# Patient Record
Sex: Male | Born: 1995 | Race: White | Hispanic: No | Marital: Single | Smoking: Never smoker
Health system: Southern US, Community
[De-identification: ages and names within clinical notes are randomized; demographics above are authoritative.]

## PROBLEM LIST (undated history)

## (undated) DIAGNOSIS — F419 Anxiety disorder, unspecified: Secondary | ICD-10-CM

---
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
## 2017-09-04 ENCOUNTER — Encounter (HOSPITAL_COMMUNITY): Payer: Self-pay | Admitting: Emergency Medicine

## 2017-09-04 ENCOUNTER — Ambulatory Visit (HOSPITAL_COMMUNITY)
Admission: EM | Admit: 2017-09-04 | Discharge: 2017-09-04 | Disposition: A | Discharge status: Home / Self Care | Payer: Self-pay | Attending: Family Medicine | Admitting: Family Medicine

## 2017-09-04 DIAGNOSIS — H66002 Acute suppurative otitis media without spontaneous rupture of ear drum, left ear: Secondary | ICD-10-CM

## 2017-09-04 MED ORDER — IBUPROFEN 800 MG PO TABS: 800.0000 mg | ORAL_TABLET | Freq: Three times a day (TID) | ORAL | 0 refills | Status: DC

## 2017-09-04 MED ORDER — CEFDINIR 300 MG PO CAPS: 300.0000 mg | ORAL_CAPSULE | Freq: Two times a day (BID) | ORAL | 0 refills | Status: AC

## 2017-09-04 NOTE — ED Triage Notes (Signed)
Pt c/o L ear pain todday

## 2017-09-04 NOTE — Discharge Instructions (Signed)
Take omnicef twice daily for 10 days  Use anti-inflammatories for pain/swelling. You may take up to 800 mg Ibuprofen every 8 hours with food. You may supplement Ibuprofen with Tylenol [phone] mg every 8 hours.   Please return if symptoms not improving

## 2017-09-04 NOTE — ED Provider Notes (Signed)
[facility]    CSN: [id number] Arrival date & time: 09/04/17  1921     History   Chief Complaint Chief Complaint  Patient presents with  . Otalgia    HPI Ryan Mckenzie is a 22 y.o. male no significant past medical history presenting today for evaluation of left ear pain.  He states that his pain began earlier today.  He woke up with a sore throat and then turned into throbbing ear pain.  He feels like he has an ear infection.  Also noting some rhinorrhea.  Is not taking anything for the congestion.  Did take an Aleve approximately 1 hour ago.  Minimal cough.  Subjective fevers.  Denies nausea or vomiting.  HPI  History reviewed. No pertinent past medical history.  There are no active problems to display for this patient.   History reviewed. No pertinent surgical history.     Home Medications    Prior to Admission medications   Medication Sig Start Date End Date Taking? Authorizing Provider  cefdinir (OMNICEF) 300 MG capsule Take 1 capsule (300 mg total) by mouth 2 (two) times daily for 10 days. 09/04/17 09/14/17  Nevaeha Finerty C, PA-C  ibuprofen (ADVIL,MOTRIN) 800 MG tablet Take 1 tablet (800 mg total) by mouth 3 (three) times daily. 09/04/17   Avriel Kandel, Junius Creamer, PA-C    Family History No family history on file.  Social History Social History   Tobacco Use  . Smoking status: Passive Smoke Exposure - Never Smoker  Substance Use Topics  . Alcohol use: No  . Drug use: No     Allergies   Patient has no known allergies.   Review of Systems Review of Systems  Constitutional: Positive for fever. Negative for activity change, appetite change and fatigue.  HENT: Positive for congestion, ear pain and rhinorrhea. Negative for postnasal drip, sinus pressure and sore throat.   Eyes: Negative for pain and itching.  Respiratory: Negative for cough and shortness of breath.   Cardiovascular: Negative for chest pain.  Gastrointestinal: Negative for abdominal  pain, diarrhea, nausea and vomiting.  Musculoskeletal: Negative for myalgias.  Skin: Negative for rash.  Neurological: Negative for dizziness, light-headedness and headaches.     Physical Exam Triage Vital Signs ED Triage Vitals [09/04/17 1938]  Enc Vitals Group     BP 136/79     Pulse Rate 68     Resp 16     Temp 98.1 F (36.7 C)     Temp src      SpO2 100 %     Weight      Height      Head Circumference      Peak Flow      Pain Score      Pain Loc      Pain Edu?      Excl. in [place]?    No data found.  Updated Vital Signs BP 136/79   Pulse 68   Temp 98.1 F (36.7 C)   Resp 16   SpO2 100%   Visual Acuity Right Eye Distance:   Left Eye Distance:   Bilateral Distance:    Right Eye Near:   Left Eye Near:    Bilateral Near:     Physical Exam  Constitutional: He appears well-developed and well-nourished.  HENT:  Head: Normocephalic and atraumatic.  Mouth/Throat: Oropharynx is clear and moist.  Left TM significantly erythematous and dull, bulging  Right TM nonerythematous, good bony landmarks and cone of light.  Eyes: Conjunctivae are normal.  Neck: Neck supple.  Cardiovascular: Normal rate and regular rhythm.  No murmur heard. Pulmonary/Chest: Effort normal and breath sounds normal. No respiratory distress.  Abdominal: Soft. There is no tenderness.  Musculoskeletal: He exhibits no edema.  Neurological: He is alert.  Skin: Skin is warm and dry.  Psychiatric: He has a normal mood and affect.  Nursing note and vitals reviewed.    UC Treatments / Results  Labs (all labs ordered are listed, but only abnormal results are displayed) Labs Reviewed - No data to display  EKG None  Radiology No results found.  Procedures Procedures (including critical care time)  Medications Ordered in UC Medications - No data to display  Initial Impression / Assessment and Plan / UC Course  I have reviewed the triage vital signs and the nursing notes.  Pertinent  labs & imaging results that were available during my care of the patient were reviewed by me and considered in my medical decision making (see chart for details).     Patient with left otitis media, will treat with Omnicef twice daily for 10 days.  Tylenol and ibuprofen for pain.  Recommended to start daily allergy pill to help with congestion and postnasal drainage related/causing sore throat.Discussed strict return precautions. Patient verbalized understanding and is agreeable with plan.  Final Clinical Impressions(s) / UC Diagnoses   Final diagnoses:  Non-recurrent acute suppurative otitis media of left ear without spontaneous rupture of tympanic membrane     Discharge Instructions     Take omnicef twice daily for 10 days  Use anti-inflammatories for pain/swelling. You may take up to 800 mg Ibuprofen every 8 hours with food. You may supplement Ibuprofen with Tylenol [phone] every 8 hours.   Please return if symptoms not improving    ED Prescriptions    Medication Sig Dispense Auth. Provider   cefdinir (OMNICEF) 300 MG capsule Take 1 capsule (300 mg total) by mouth 2 (two) times daily for 10 days. 20 capsule Elyan Vanwieren C, PA-C   ibuprofen (ADVIL,MOTRIN) 800 MG tablet Take 1 tablet (800 mg total) by mouth 3 (three) times daily. 21 tablet Floree Zuniga, [place] C, PA-C     Controlled Substance Prescriptions [place] Controlled Substance Registry consulted? Not Applicable   Lew DawesWieters, Kadisha Goodine C, [place]PA-C 09/04/17 2013

## 2018-11-23 ENCOUNTER — Other Ambulatory Visit: Payer: Self-pay

## 2018-11-23 DIAGNOSIS — Z20822 Contact with and (suspected) exposure to covid-19: Secondary | ICD-10-CM

## 2018-11-24 LAB — NOVEL CORONAVIRUS, NAA: SARS-CoV-2, NAA: NOT DETECTED

## 2019-02-24 ENCOUNTER — Other Ambulatory Visit: Payer: Self-pay

## 2019-02-24 DIAGNOSIS — Z20822 Contact with and (suspected) exposure to covid-19: Secondary | ICD-10-CM

## 2019-02-27 LAB — NOVEL CORONAVIRUS, NAA: SARS-CoV-2, NAA: NOT DETECTED

## 2019-05-02 ENCOUNTER — Other Ambulatory Visit: Payer: Self-pay

## 2019-05-02 ENCOUNTER — Ambulatory Visit (HOSPITAL_COMMUNITY)
Admission: EM | Admit: 2019-05-02 | Discharge: 2019-05-02 | Disposition: A | Discharge status: Home / Self Care | Payer: Self-pay | Attending: Internal Medicine | Admitting: Internal Medicine

## 2019-05-02 ENCOUNTER — Encounter (HOSPITAL_COMMUNITY): Payer: Self-pay

## 2019-05-02 DIAGNOSIS — N23 Unspecified renal colic: Secondary | ICD-10-CM

## 2019-05-02 LAB — POCT URINALYSIS DIP (DEVICE)
Bilirubin Urine: NEGATIVE
Glucose, UA: NEGATIVE mg/dL
Ketones, ur: NEGATIVE mg/dL
Leukocytes,Ua: NEGATIVE
Nitrite: NEGATIVE
Protein, ur: NEGATIVE mg/dL
Specific Gravity, Urine: <=1.005 (ref 1.005–1.030)
Urobilinogen, UA: 0.2 mg/dL (ref 0.0–1.0)
pH: 6.5 (ref 5.0–8.0)

## 2019-05-02 MED ORDER — IBUPROFEN 600 MG PO TABS: 600.0000 mg | ORAL_TABLET | Freq: Four times a day (QID) | ORAL | 0 refills | Status: DC | PRN

## 2019-05-02 MED ORDER — TAMSULOSIN HCL 0.4 MG PO CAPS: 0.4000 mg | ORAL_CAPSULE | Freq: Every day | ORAL | 0 refills | Status: AC

## 2019-05-02 NOTE — ED Triage Notes (Signed)
Pt presents to UC with right lower quadrant pain and urinary frequency since last night.

## 2019-05-02 NOTE — ED Provider Notes (Signed)
[facility]    CSN: [id number] Arrival date & time: 05/02/19  0831      History   Chief Complaint Chief Complaint  Patient presents with  . Abdominal Pain  . Urinary Frequency    HPI Ryan Mckenzie is a 24 y.o. male with no past medical history comes to urgent care with complaint of right flank/abdominal pain of 1 day duration.  Onset of pain was sudden and is currently 6 out of 10.  Pain is sharp and shooting at times and at other times is dull.  No known aggravating or relieving factors.  The pain comes in waves and radiates to the right groin region.  No nausea or vomiting.  Patient admits to having some dysuria but no frequency, urgency or hematuria.  Patient denies a history of kidney stones.  HPI  History reviewed. No pertinent past medical history.  There are no problems to display for this patient.   History reviewed. No pertinent surgical history.     Home Medications    Prior to Admission medications   Medication Sig Start Date End Date Taking? Authorizing Provider  ibuprofen (ADVIL) 600 MG tablet Take 1 tablet (600 mg total) by mouth every 6 (six) hours as needed. 05/02/19   Shemicka Cohrs, Myrene Galas, MD  tamsulosin (FLOMAX) 0.4 MG CAPS capsule Take 1 capsule (0.4 mg total) by mouth daily for 7 days. 05/02/19 05/09/19  Chase Picket, MD    Family History History reviewed. No pertinent family history.  Social History Social History   Tobacco Use  . Smoking status: Passive Smoke Exposure - Never Smoker  . Smokeless tobacco: Never Used  Substance Use Topics  . Alcohol use: No  . Drug use: No     Allergies   Patient has no known allergies.   Review of Systems Review of Systems  Constitutional: Negative for chills, fatigue and fever.  Respiratory: Negative for shortness of breath.   Gastrointestinal: Negative for abdominal pain, nausea and vomiting.  Genitourinary: Positive for dysuria and flank pain. Negative for discharge, enuresis, frequency,  genital sores, hematuria, penile pain, scrotal swelling, testicular pain and urgency.  Skin: Negative for rash and wound.  Neurological: Negative for dizziness and numbness.  Psychiatric/Behavioral: Negative for confusion and decreased concentration.     Physical Exam Triage Vital Signs ED Triage Vitals  Enc Vitals Group     BP 05/02/19 0927 135/72     Pulse Rate 05/02/19 0927 79     Resp --      Temp 05/02/19 0927 98.7 F (37.1 C)     Temp Source 05/02/19 0927 Oral     SpO2 05/02/19 0927 98 %     Weight --      Height --      Head Circumference --      Peak Flow --      Pain Score 05/02/19 0926 6     Pain Loc --      Pain Edu? --      Excl. in [place]? --    No data found.  Updated Vital Signs BP 135/72 (BP Location: Left Arm)   Pulse 79   Temp 98.7 F (37.1 C) (Oral)   SpO2 98%   Visual Acuity Right Eye Distance:   Left Eye Distance:   Bilateral Distance:    Right Eye Near:   Left Eye Near:    Bilateral Near:     Physical Exam Vitals and nursing note reviewed.  Constitutional:  General: He is not in acute distress.    Appearance: He is well-developed. He is not ill-appearing.  Cardiovascular:     Rate and Rhythm: Normal rate and regular rhythm.  Pulmonary:     Effort: Pulmonary effort is normal.     Breath sounds: Normal breath sounds. No wheezing or rhonchi.  Abdominal:     Palpations: Abdomen is rigid. There is no shifting dullness, hepatomegaly or splenomegaly.     Tenderness: There is no abdominal tenderness. There is no right CVA tenderness, left CVA tenderness or guarding.     Hernia: No hernia is present.  Skin:    Capillary Refill: Capillary refill takes less than 2 seconds.  Neurological:     General: No focal deficit present.     Mental Status: He is alert.      UC Treatments / Results  Labs (all labs ordered are listed, but only abnormal results are displayed) Labs Reviewed  POCT URINALYSIS DIP (DEVICE) - Abnormal; Notable for the  following components:      Result Value   Hgb urine dipstick TRACE (*)    All other components within normal limits  URINE CULTURE    EKG   Radiology No results found.  Procedures Procedures (including critical care time)  Medications Ordered in UC Medications - No data to display  Initial Impression / Assessment and Plan / UC Course  I have reviewed the triage vital signs and the nursing notes.  Pertinent labs & imaging results that were available during my care of the patient were reviewed by me and considered in my medical decision making (see chart for details).    1.  Nephrolithiasis with right renal colic: Urinalysis is positive for blood Patient most likely has kidney stone which may have passed. Urine culture sent Tamsulosin 0.4 mg orally daily for 7 days Ibuprofen 600 mg every 6 hours as needed for pain If patient's pain worsens or if he develops fever, chills, worsening flank pain, nausea vomiting-he is advised to return to urgent care to be reevaluated.  If the urine culture grows any bacteria patient's treatment regimen will be updated to reflect the urine cultures. Final Clinical Impressions(s) / UC Diagnoses   Final diagnoses:  Renal colic on right side   Discharge Instructions   None    ED Prescriptions    Medication Sig Dispense Auth. Provider   ibuprofen (ADVIL) 600 MG tablet Take 1 tablet (600 mg total) by mouth every 6 (six) hours as needed. 30 tablet Jacquese Cassarino, Britta Mccreedy, MD   tamsulosin (FLOMAX) 0.4 MG CAPS capsule Take 1 capsule (0.4 mg total) by mouth daily for 7 days. 7 capsule Candiss Galeana, Britta Mccreedy, MD     PDMP not reviewed this encounter.   Merrilee Jansky, MD 05/03/19 [phone]

## 2019-05-03 LAB — URINE CULTURE: Culture: NO GROWTH

## 2019-07-14 ENCOUNTER — Other Ambulatory Visit: Payer: Self-pay

## 2019-07-14 ENCOUNTER — Ambulatory Visit (HOSPITAL_COMMUNITY)
Admission: EM | Admit: 2019-07-14 | Discharge: 2019-07-14 | Disposition: A | Discharge status: Home / Self Care | Payer: 59 | Attending: Family Medicine | Admitting: Family Medicine

## 2019-07-14 ENCOUNTER — Encounter (HOSPITAL_COMMUNITY): Payer: Self-pay

## 2019-07-14 DIAGNOSIS — Z20822 Contact with and (suspected) exposure to covid-19: Secondary | ICD-10-CM | POA: Diagnosis present

## 2019-07-14 DIAGNOSIS — Z9109 Other allergy status, other than to drugs and biological substances: Secondary | ICD-10-CM | POA: Insufficient documentation

## 2019-07-14 NOTE — ED Provider Notes (Signed)
[place]    CSN: [id number] Arrival date & time: 07/14/19  0818      History   Chief Complaint Chief Complaint  Patient presents with  . Allergy  . Loss of Taste  . Loss of Smell    HPI Ryan Mckenzie is a 24 y.o. male.   HPI  Ryan Mckenzie states that he has allergies.  They run in his family.  He states that he has had allergy symptoms for the last couple of days.  Last night when he woke up in the middle of the night to get a drink, he felt like he could not taste or smell.  He went to the refrigerator and got a soft drink.  He states that he could not tasted.  He is here for coronavirus testing.  He is worried because his mother has health issues, lung disease. He has not had any cough or shortness of breath.  No body aches or headache.  He does have runny nose, postnasal drip, sneezing, eyes itching, tearing, mild sore throat  History reviewed. No pertinent past medical history.  There are no problems to display for this patient.   History reviewed. No pertinent surgical history.     Home Medications    Prior to Admission medications   Medication Sig Start Date End Date Taking? Authorizing Provider  ibuprofen (ADVIL) 600 MG tablet Take 1 tablet (600 mg total) by mouth every 6 (six) hours as needed. 05/02/19   Lamptey, Myrene Galas, MD    Family History Family History  Problem Relation Age of Onset  . COPD Mother   . Healthy Brother     Social History Social History   Tobacco Use  . Smoking status: Passive Smoke Exposure - Never Smoker  . Smokeless tobacco: Never Used  Substance Use Topics  . Alcohol use: No  . Drug use: No     Allergies   Patient has no known allergies.   Review of Systems Review of Systems  Constitutional: Negative for chills and fever.  HENT: Positive for congestion, postnasal drip, rhinorrhea and sore throat.   Eyes: Positive for discharge and itching.  Musculoskeletal: Negative for myalgias.  Neurological: Negative for  headaches.     Physical Exam Triage Vital Signs ED Triage Vitals  Enc Vitals Group     BP 07/14/19 0840 (!) 143/94     Pulse Rate 07/14/19 0840 70     Resp 07/14/19 0840 18     Temp 07/14/19 0840 98.1 F (36.7 C)     Temp Source 07/14/19 0840 Oral     SpO2 07/14/19 0840 100 %     Weight --      Height --      Head Circumference --      Peak Flow --      Pain Score 07/14/19 0842 1     Pain Loc --      Pain Edu? --      Excl. in [place]? --    No data found.  Updated Vital Signs BP (!) 143/94 (BP Location: Right Arm)   Pulse 70   Temp 98.1 F (36.7 C) (Oral)   Resp 18   SpO2 100%      Physical Exam Constitutional:      General: He is not in acute distress.    Appearance: He is well-developed and normal weight.  HENT:     Head: Normocephalic and atraumatic.     Right Ear: Tympanic membrane, ear  canal and external ear normal.     Left Ear: Tympanic membrane, ear canal and external ear normal.     Nose: Congestion present.     Comments: Nasal membranes congested.  Clear rhinorrhea.  No sinus tenderness    Mouth/Throat:     Comments: Posterior pharyngeal erythema Eyes:     Conjunctiva/sclera: Conjunctivae normal.     Pupils: Pupils are equal, round, and reactive to light.  Cardiovascular:     Rate and Rhythm: Normal rate and regular rhythm.  Pulmonary:     Effort: Pulmonary effort is normal. No respiratory distress.     Breath sounds: Normal breath sounds.  Musculoskeletal:        General: Normal range of motion.     Cervical back: Normal range of motion.  Skin:    General: Skin is warm and dry.  Neurological:     General: No focal deficit present.     Mental Status: He is alert.  Psychiatric:        Mood and Affect: Mood normal.        Behavior: Behavior normal.      UC Treatments / Results  Labs (all labs ordered are listed, but only abnormal results are displayed) Labs Reviewed  SARS CORONAVIRUS 2 (TAT 6-24 HRS)    EKG   Radiology No results  found.  Procedures Procedures (including critical care time)  Medications Ordered in UC Medications - No data to display  Initial Impression / Assessment and Plan / UC Course  I have reviewed the triage vital signs and the nursing notes.  Pertinent labs & imaging results that were available during my care of the patient were reviewed by me and considered in my medical decision making (see chart for details).     We reviewed that he needs to quarantine until he gets his Covid test result.  He has been compliant with social distancing, wearing a mask and handwashing. Reviewed over-the-counter medicines for allergies Final Clinical Impressions(s) / UC Diagnoses   Final diagnoses:  Environmental allergies  Encounter for laboratory testing for COVID-19 virus     Discharge Instructions     Go home to rest Drink plenty of fluids Take Tylenol for pain or fever Take zyrtec or generic cetirizine for allergies You may take over-the-counter cough and cold medicines as needed You must quarantine at home until your test result is available You can check for your test result in MyChart    ED Prescriptions    None     PDMP not reviewed this encounter.   Eustace Moore, MD 07/14/19 [phone]

## 2019-07-14 NOTE — Discharge Instructions (Signed)
Go home to rest Drink plenty of fluids Take Tylenol for pain or fever Take zyrtec or generic cetirizine for allergies You may take over-the-counter cough and cold medicines as needed You must quarantine at home until your test result is available You can check for your test result in MyChart

## 2019-07-14 NOTE — ED Triage Notes (Signed)
Pt presents with allergy flare up & loss of taste and loss of smell since yesterday.

## 2019-07-15 LAB — SARS CORONAVIRUS 2 (TAT 6-24 HRS): SARS Coronavirus 2: NEGATIVE

## 2019-12-06 ENCOUNTER — Encounter (HOSPITAL_COMMUNITY): Payer: Self-pay | Admitting: Emergency Medicine

## 2019-12-06 ENCOUNTER — Other Ambulatory Visit: Payer: Self-pay

## 2019-12-06 ENCOUNTER — Ambulatory Visit (HOSPITAL_COMMUNITY)
Admission: EM | Admit: 2019-12-06 | Discharge: 2019-12-06 | Disposition: A | Discharge status: Home / Self Care | Payer: 59 | Attending: Physician Assistant | Admitting: Physician Assistant

## 2019-12-06 DIAGNOSIS — B349 Viral infection, unspecified: Secondary | ICD-10-CM | POA: Diagnosis not present

## 2019-12-06 DIAGNOSIS — J069 Acute upper respiratory infection, unspecified: Secondary | ICD-10-CM | POA: Diagnosis not present

## 2019-12-06 DIAGNOSIS — R319 Hematuria, unspecified: Secondary | ICD-10-CM | POA: Diagnosis not present

## 2019-12-06 DIAGNOSIS — Z20822 Contact with and (suspected) exposure to covid-19: Secondary | ICD-10-CM | POA: Diagnosis present

## 2019-12-06 LAB — POCT URINALYSIS DIPSTICK, ED / UC
Bilirubin Urine: NEGATIVE
Glucose, UA: NEGATIVE mg/dL
Leukocytes,Ua: NEGATIVE
Nitrite: NEGATIVE
Protein, ur: NEGATIVE mg/dL
Specific Gravity, Urine: 1.02 (ref 1.005–1.030)
Urobilinogen, UA: 0.2 mg/dL (ref 0.0–1.0)
pH: 5.5 (ref 5.0–8.0)

## 2019-12-06 LAB — SARS CORONAVIRUS 2 (TAT 6-24 HRS): SARS Coronavirus 2: NEGATIVE

## 2019-12-06 LAB — POCT RAPID STREP A, ED / UC: Streptococcus, Group A Screen (Direct): NEGATIVE

## 2019-12-06 MED ORDER — ONDANSETRON HCL 4 MG PO TABS: 4.0000 mg | ORAL_TABLET | Freq: Three times a day (TID) | ORAL | 0 refills | Status: DC | PRN

## 2019-12-06 MED ORDER — FLUTICASONE PROPIONATE 50 MCG/ACT NA SUSP: 1.0000 | Freq: Every day | NASAL | 2 refills | Status: DC

## 2019-12-06 MED ORDER — ACETAMINOPHEN 325 MG PO TABS: 650.0000 mg | ORAL_TABLET | Freq: Four times a day (QID) | ORAL | 0 refills | Status: DC | PRN

## 2019-12-06 NOTE — Discharge Instructions (Addendum)
Take the nausea medicine as needed Take Tylenol for body ache and chills Use Flonase for congestion Hydrate well  There is a small amount of blood in your urine today, I am not convinced you are having a kidney stone.  You need to have this followed up and repeat urinalysis in about 2 weeks, follow-up with your primary care or establish with a new 1. You may also consider following up with the Urology group for the blood in your urine  If you are not improving throughout the week, return to this clinic.  If severely worsening symptoms high fevers, shortness of breath severe abdominal pain go to the emergency department  If your Covid-19 test is positive, you will receive a phone call from [facility] regarding your results. Negative test results are not called. Both positive and negative results area always visible on MyChart. If you do not have a MyChart account, sign up instructions are in your discharge papers.   Persons who are directed to care for themselves at home may discontinue isolation under the following conditions:   At least 10 days have passed since symptom onset and  At least 24 hours have passed without running a fever (this means without the use of fever-reducing medications) and  Other symptoms have improved.  Persons infected with COVID-19 who never develop symptoms may discontinue isolation and other precautions 10 days after the date of their first positive COVID-19 test.

## 2019-12-06 NOTE — ED Provider Notes (Signed)
[facility]    CSN: [id number] Arrival date & time: 12/06/19  4401      History   Chief Complaint Chief Complaint  Patient presents with  . URI    HPI Ryan Mckenzie is a 24 y.o. male.   Patient presents for 1 day history of nausea, vomiting, sore throat and nasal congestion.  Also reports feeling chills and sweating last night in bed.  No measured temperature at home.  Reports yesterday woke up with nausea and several episodes of vomiting.  Vomiting stopped around lunchtime yesterday.  He was not able to keep water or food down yesterday morning.  He reports nausea since then but has been able keep down some water and ginger ale.  No vomiting today but persistent nausea.  Also woke up with a headache.  Took Tylenol for this and the headache went away.  Reports had a sore throat after vomiting.  Denies any blood or dark color of his vomit.  Denies diarrhea.  Has been moving his bowels per usual.  He does report a little less urine, that is darker yellow.  Has gone 3-4 times yesterday.  Has not urinated today, has to urinate now..  Denies any blood in the urine.  Does endorse having kidney stones previously, but denies any back or flank pain.  Good stream however just seems less than usual.  Denies painful urination or frequency.  Denies cough, shortness of breath or abdominal pain.  No known sick contacts.  Did not receive Covid vaccines.  Reports he went to bed around 6 last night woke up this morning.     History reviewed. No pertinent past medical history.  There are no problems to display for this patient.   History reviewed. No pertinent surgical history.     Home Medications    Prior to Admission medications   Medication Sig Start Date End Date Taking? Authorizing Provider  acetaminophen (TYLENOL) 325 MG tablet Take 2 tablets (650 mg total) by mouth every 6 (six) hours as needed. 12/06/19   Kaliyah Gladman, Veryl Speak, PA-C  fluticasone (FLONASE) 50 MCG/ACT nasal spray Place 1  spray into both nostrils daily. 12/06/19   Odena Mcquaid, Veryl Speak, PA-C  ibuprofen (ADVIL) 600 MG tablet Take 1 tablet (600 mg total) by mouth every 6 (six) hours as needed. 05/02/19   Lamptey, Britta Mccreedy, MD  ondansetron (ZOFRAN) 4 MG tablet Take 1 tablet (4 mg total) by mouth every 8 (eight) hours as needed for nausea or vomiting. 12/06/19   Deontay Ladnier, Veryl Speak, PA-C    Family History Family History  Problem Relation Age of Onset  . COPD Mother   . Healthy Brother     Social History Social History   Tobacco Use  . Smoking status: Passive Smoke Exposure - Never Smoker  . Smokeless tobacco: Never Used  Vaping Use  . Vaping Use: Every day  Substance Use Topics  . Alcohol use: No  . Drug use: No     Allergies   Patient has no known allergies.   Review of Systems Review of Systems   Physical Exam Triage Vital Signs ED Triage Vitals  Enc Vitals Group     BP 12/06/19 1105 126/69     Pulse Rate 12/06/19 1105 68     Resp 12/06/19 1105 18     Temp 12/06/19 1105 98.5 F (36.9 C)     Temp Source 12/06/19 1105 Oral     SpO2 12/06/19 1105 100 %  Weight --      Height --      Head Circumference --      Peak Flow --      Pain Score 12/06/19 1106 0     Pain Loc --      Pain Edu? --      Excl. in [place]? --    No data found.  Updated Vital Signs BP 126/69 (BP Location: Right Arm)   Pulse 68   Temp 98.5 F (36.9 C) (Oral)   Resp 18   SpO2 100%   Visual Acuity Right Eye Distance:   Left Eye Distance:   Bilateral Distance:    Right Eye Near:   Left Eye Near:    Bilateral Near:     Physical Exam Vitals and nursing note reviewed.  Constitutional:      General: He is not in acute distress.    Appearance: Normal appearance. He is well-developed. He is not ill-appearing.  HENT:     Head: Normocephalic and atraumatic.     Right Ear: Tympanic membrane normal.     Left Ear: Tympanic membrane normal.     Nose: Congestion present.     Mouth/Throat:     Pharynx: Posterior  oropharyngeal erythema present.  Eyes:     Extraocular Movements: Extraocular movements intact.     Conjunctiva/sclera: Conjunctivae normal.     Pupils: Pupils are equal, round, and reactive to light.  Cardiovascular:     Rate and Rhythm: Normal rate and regular rhythm.     Heart sounds: No murmur heard.   Pulmonary:     Effort: Pulmonary effort is normal. No respiratory distress.     Breath sounds: Normal breath sounds. No wheezing, rhonchi or rales.  Abdominal:     Palpations: Abdomen is soft.     Tenderness: There is no abdominal tenderness. There is no right CVA tenderness or left CVA tenderness.  Musculoskeletal:     Cervical back: Neck supple.     Right lower leg: No edema.     Left lower leg: No edema.  Lymphadenopathy:     Cervical: No cervical adenopathy.  Skin:    General: Skin is warm and dry.     Findings: No rash.  Neurological:     General: No focal deficit present.     Mental Status: He is alert and oriented to person, place, and time.      UC Treatments / Results  Labs (all labs ordered are listed, but only abnormal results are displayed) Labs Reviewed  POCT URINALYSIS DIPSTICK, ED / UC - Abnormal; Notable for the following components:      Result Value   Ketones, ur TRACE (*)    Hgb urine dipstick SMALL (*)    All other components within normal limits  SARS CORONAVIRUS 2 (TAT 6-24 HRS)  CULTURE, GROUP A STREP [facility])  POCT RAPID STREP A, ED / UC    EKG   Radiology No results found.  Procedures Procedures (including critical care time)  Medications Ordered in UC Medications - No data to display  Initial Impression / Assessment and Plan / UC Course  I have reviewed the triage vital signs and the nursing notes.  Pertinent labs & imaging results that were available during my care of the patient were reviewed by me and considered in my medical decision making (see chart for details).     #Viral illness #Hematuria #Covid testing Patient is  24 year old otherwise healthy male presenting with viral illness.  Found to have some hematuria, urinalysis obtained given change in urinary pattern.  Otherwise unrevealing.  Doubt kidney stone, unclear etiology for hematuria.  Is afebrile with normal vital signs and otherwise reassuring exam.  Covid test sent.  Rapid strep was negative, culture sent.  We will treat him symptomatically.  We will get him to establish with a primary care for recheck of his urine.  Also discussed that he may consider following with the urologist as well.  Discussed return, follow-up and emergency department precautions.  Patient verbalized understanding and agreement plan of care. Final Clinical Impressions(s) / UC Diagnoses   Final diagnoses:  Viral illness  Encounter for laboratory testing for COVID-19 virus  Hematuria, unspecified type     Discharge Instructions     Take the nausea medicine as needed Take Tylenol for body ache and chills Use Flonase for congestion Hydrate well  There is a small amount of blood in your urine today, I am not convinced you are having a kidney stone.  You need to have this followed up and repeat urinalysis in about 2 weeks, follow-up with your primary care or establish with a new 1. You may also consider following up with the Urology group for the blood in your urine  If you are not improving throughout the week, return to this clinic.  If severely worsening symptoms high fevers, shortness of breath severe abdominal pain go to the emergency department  If your Covid-19 test is positive, you will receive a phone call from [facility] regarding your results. Negative test results are not called. Both positive and negative results area always visible on MyChart. If you do not have a MyChart account, sign up instructions are in your discharge papers.   Persons who are directed to care for themselves at home may discontinue isolation under the following conditions:  . At least  10 days have passed since symptom onset and . At least 24 hours have passed without running a fever (this means without the use of fever-reducing medications) and . Other symptoms have improved.  Persons infected with COVID-19 who never develop symptoms may discontinue isolation and other precautions 10 days after the date of their first positive COVID-19 test.       ED Prescriptions    Medication Sig Dispense Auth. Provider   ondansetron (ZOFRAN) 4 MG tablet Take 1 tablet (4 mg total) by mouth every 8 (eight) hours as needed for nausea or vomiting. 12 tablet Sherre Wooton, Veryl Speak, PA-C   acetaminophen (TYLENOL) 325 MG tablet Take 2 tablets (650 mg total) by mouth every 6 (six) hours as needed. 30 tablet Faithann Natal, Veryl Speak, PA-C   fluticasone (FLONASE) 50 MCG/ACT nasal spray Place 1 spray into both nostrils daily. 15.8 mL Lithzy Bernard, Veryl Speak, PA-C     PDMP not reviewed this encounter.   Hermelinda Medicus, PA-C 12/06/19 1221

## 2019-12-06 NOTE — ED Triage Notes (Signed)
Patient presents to [facility] for assessment of several episodes of nausea and emesis yesterday morning.  Patient states he had chills through-out the day yesterday.  This morning woke up to nasal congestion, chills, body aches and continued nausea.  Denies abdominal pain.

## 2019-12-08 LAB — CULTURE, GROUP A STREP (THRC)

## 2021-08-20 ENCOUNTER — Other Ambulatory Visit (HOSPITAL_COMMUNITY)
Admission: EM | Admit: 2021-08-20 | Discharge: 2021-08-23 | Disposition: A | Discharge status: Home / Self Care | Payer: No Payment, Other | Attending: Psychiatry | Admitting: Psychiatry

## 2021-08-20 DIAGNOSIS — Y903 Blood alcohol level of 60-79 mg/100 ml: Secondary | ICD-10-CM | POA: Insufficient documentation

## 2021-08-20 DIAGNOSIS — Z79899 Other long term (current) drug therapy: Secondary | ICD-10-CM | POA: Insufficient documentation

## 2021-08-20 DIAGNOSIS — F419 Anxiety disorder, unspecified: Secondary | ICD-10-CM | POA: Insufficient documentation

## 2021-08-20 DIAGNOSIS — F10930 Alcohol use, unspecified with withdrawal, uncomplicated: Secondary | ICD-10-CM

## 2021-08-20 DIAGNOSIS — F1010 Alcohol abuse, uncomplicated: Secondary | ICD-10-CM | POA: Diagnosis present

## 2021-08-20 DIAGNOSIS — F32A Anxiety disorder, unspecified: Secondary | ICD-10-CM

## 2021-08-20 DIAGNOSIS — F331 Major depressive disorder, recurrent, moderate: Secondary | ICD-10-CM | POA: Insufficient documentation

## 2021-08-20 DIAGNOSIS — Z20822 Contact with and (suspected) exposure to covid-19: Secondary | ICD-10-CM | POA: Insufficient documentation

## 2021-08-20 DIAGNOSIS — F10230 Alcohol dependence with withdrawal, uncomplicated: Secondary | ICD-10-CM | POA: Insufficient documentation

## 2021-08-20 NOTE — BH Assessment (Signed)
Comprehensive Clinical Assessment (CCA) Note  08/21/2021 Ryan Mckenzie [id number]  Disposition: Ryan Guadeloupeoy Williams, NP, [facility]   The patient demonstrates the following risk factors for suicide: Chronic risk factors for suicide include: psychiatric disorder of depression and substance use disorder. Acute risk factors for suicide include: social withdrawal/isolation. Protective factors for this patient include: positive social support, responsibility to others (children, family), coping skills, and hope for the future. Considering these factors, the overall suicide risk at this point appears to be low. Patient is not appropriate for outpatient follow up.  Ryan Mckenzie is a 26 year old male [facility]. Patient denied SI, HI and psychosis. Patient started drinking at the age of 89. Patient reported drinking approx 7x 40 ounce large beers daily and spends approx $300 weekly on alcohol. Patient reported last drink was today, 2x large 40 ounce beers. Patient reported stressors include, money, paying bills "getting paid and then being broke by the end of the week". 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 Patient reported worsening depressive symptoms. Patient denied prior psych hospitalizations, suicide attempts and self-harming behaviors. Patient denied receiving any outpatient mental health services. Patient is currently prescribed Zoloft by his PCP. Patient reported psych medications are not working that well. Patient resides with mother and 25 year old brother. Patient reported no family conflict. Patient was calm and cooperative during assessment. Patient is urgent.  Chief Complaint:  Chief Complaint  Patient presents with   Alcohol Problem   Visit Diagnosis:  Alcohol dependence Major depressive disorder   CCA Screening, Triage and  Referral (STR)  Patient Reported Information How did you hear about us? Family/Friend  What Is the Reason for Your Visit/Call Today? Ryan Mckenzie is a 26 year old male [facility] detox. Patient denied SI, HI and psychosis. Patient started drinking at the age of 26. Patient reported drinking approx 7x 40 ounce large beers daily and spends approx $300 weekly on alcohol. Patient reported last drink was today, 2x large 40 ounce beers. Patient reported stressors include, money, paying bills "getting paid and then being broke by the end of the week". 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 Patient reported worsening depressive symptoms. Patient denied prior psych hospitalizations, suicide attempts and self-harming behaviors. Patient denied receiving any outpatient mental health services. Patient is currently prescribed Zoloft by his PCP. Patient reported psych medications are not working that well. Patient resides with mother and 25 year old brother. Patient reported no family conflict. Patient was calm and cooperative during assessment. Patient is urgent.  How Long Has This Been Causing You Problems? > than 6 months  What Do You Feel Would Help You the Most Today? Alcohol or Drug Use Treatment   Have You Recently Had Any Thoughts About Hurting Yourself? No  Are You Planning to Commit Suicide/Harm Yourself At This time? No   Have you Recently Had Thoughts About Hurting Someone [name]? No  Are You Planning to Harm Someone at This Time? No  Explanation: No data recorded  Have You Used Any Alcohol or Drugs in the Past 24 Hours? Yes  How Long Ago Did You Use Drugs or Alcohol? No data recorded What Did You Use and How Much? 2x 40 ounce large beers   Do  You Currently Have a Therapist/Psychiatrist? No  Name of Therapist/Psychiatrist: No data recorded  Have You  Been Recently Discharged From Any Office Practice or Programs? No  Explanation of Discharge From Practice/Program: No data recorded    CCA Screening Triage Referral Assessment Type of Contact: Face-to-Face  Telemedicine Service Delivery:   Is this Initial or Reassessment? No data recorded Date Telepsych consult ordered in [facility]:  No data recorded Time Telepsych consult ordered in [facility]:  No data recorded Location of Assessment: [facility]  Provider Location: [facility]   Collateral Involvement: Amy [place], mother   Does Patient Have a Automotive engineer Guardian? No data recorded Name and Contact of Legal Guardian: No data recorded If Minor and Not Living with Parent(s), Who has Custody? No data recorded Is CPS involved or ever been involved? Never  Is APS involved or ever been involved? Never   Patient Determined To Be At Risk for Harm To Self or Others Based on Review of Patient Reported Information or Presenting Complaint? No data recorded Method: No data recorded Availability of Means: No data recorded Intent: No data recorded Notification Required: No data recorded Additional Information for Danger to Others Potential: No data recorded Additional Comments for Danger to Others Potential: No data recorded Are There Guns or Other Weapons in Your Home? No data recorded Types of Guns/Weapons: No data recorded Are These Weapons Safely Secured?                            No data recorded Who Could Verify You Are Able To Have These Secured: No data recorded Do You Have any Outstanding Charges, Pending Court Dates, Parole/Probation? No data recorded Contacted To Inform of Risk of Harm To Self or Others: No data recorded   Does Patient Present under Involuntary Commitment? No  IVC Papers Initial File Date: No data recorded  [place] of Residence: [place]   Patient Currently Receiving the Following Services: Not Receiving  Services   Determination of Need: Urgent (48 hours)   Options For Referral: Facility-Based Crisis; Outpatient Therapy; Medication Management     CCA Biopsychosocial Patient Reported Schizophrenia/Schizoaffective Diagnosis in Past: No data recorded  Strengths: self-awareness   Mental Health Symptoms Depression:   Hopelessness; Tearfulness; Sleep (too much or little); Fatigue; Irritability; Difficulty Concentrating; Increase/decrease in appetite; Worthlessness; Change in energy/activity   Duration of Depressive symptoms:  Duration of Depressive Symptoms: Greater than two weeks   Mania:   None   Anxiety:    Worrying; Tension; Sleep; Restlessness   Psychosis:   None   Duration of Psychotic symptoms:    Trauma:   None   Obsessions:   None   Compulsions:   None   Inattention:   None   Hyperactivity/Impulsivity:   None   Oppositional/Defiant Behaviors:   None   Emotional Irregularity:   None   Other Mood/Personality Symptoms:  No data recorded   Mental Status Exam Appearance and self-care  Stature:   Average   Weight:   Average weight   Clothing:   Age-appropriate   Grooming:   Normal   Cosmetic use:   None   Posture/gait:   Normal   Motor activity:   Not Remarkable   Sensorium  Attention:   Normal   Concentration:   Normal   Orientation:   X5   Recall/memory:   Normal   Affect and Mood  Affect:   Appropriate  Mood:   Depressed   Relating  Eye contact:   Normal   Facial expression:   Depressed   Attitude toward examiner:   Cooperative   Thought and Language  Speech flow:  Normal   Thought content:   Appropriate to Mood and Circumstances   Preoccupation:   None   Hallucinations:   None   Organization:  No data recorded  [employer] of Knowledge:   Average   Intelligence:   Average   Abstraction:   Normal   Judgement:   Normal   Reality Testing:   Adequate   Insight:    Fair   Decision Making:   Normal   Social Functioning  Social Maturity:   Responsible   Social Judgement:   Normal   Stress  Stressors:   Office manager Ability:   Human resources officer Deficits:   Self-control; Decision making   Supports:   Family; Friends/Service system     Religion: Religion/Spirituality Are You A Religious Person?:  Industrial/product designer)  Leisure/Recreation: Leisure / Recreation Do You Have Hobbies?: Yes Leisure and Hobbies: "fishing, building model cars and working my truck"  Exercise/Diet: Exercise/Diet Do You Exercise?: No Do You Follow a Special Diet?: No Explanation of Sleeping Difficulties: 4-5 hours   CCA Employment/Education Employment/Work Situation: Employment / Work Situation Employment Situation: Employed Work Stressors: none  Education: Education Is Patient Currently Attending School?: No Last Grade Completed: 12 Did You Product manager?: No Did You Have An Individualized Education Program (IIEP): No Did You Have Any Difficulty At [employer]?: No   CCA Family/Childhood History Family and Relationship History: Family history Marital status: Single Does patient have children?: No  Childhood History:  Childhood History By whom was/is the patient raised?: Mother Did patient suffer any verbal/emotional/physical/sexual abuse as a child?: No Did patient suffer from severe childhood neglect?: No Has patient ever been sexually abused/assaulted/raped as an adolescent or adult?: No Was the patient ever a victim of a crime or a disaster?: No  Child/Adolescent Assessment:     CCA Substance Use Alcohol/Drug Use: Alcohol / Drug Use Pain Medications: see MAR Prescriptions: see MAR Over the Counter: see MAR History of alcohol / drug use?: Yes Substance #1 Name of Substance 1: alcohol 1 - Age of First Use: 22 1 - Amount (size/oz): 7x 40 ounces 1 - Frequency: daily 1 - Last Use / Amount: today                        ASAM's:  Six Dimensions of Multidimensional Assessment  Dimension 1:  Acute Intoxication and/or Withdrawal Potential:      Dimension 2:  Biomedical Conditions and Complications:      Dimension 3:  Emotional, Behavioral, or Cognitive Conditions and Complications:     Dimension 4:  Readiness to Change:     Dimension 5:  Relapse, Continued use, or Continued Problem Potential:     Dimension 6:  Recovery/Living Environment:     ASAM Severity Score:    ASAM Recommended Level of Treatment:     Substance use Disorder (SUD)    Recommendations for Services/Supports/Treatments: Recommendations for Services/Supports/Treatments Recommendations For Services/Supports/Treatments: Facility Based Crisis, Individual Therapy, Medication Management  Discharge Disposition:    DSM5 Diagnoses: Patient Active Problem List   Diagnosis Date Noted   Alcohol abuse 08/21/2021     Referrals to Alternative Service(s): Referred to Alternative Service(s):   Place:   Date:   Time:  Referred to Alternative Service(s):   Place:   Date:   Time:    Referred to Alternative Service(s):   Place:   Date:   Time:    Referred to Alternative Service(s):   Place:   Date:   Time:     Venora Maples, [facility]

## 2021-08-21 ENCOUNTER — Other Ambulatory Visit: Payer: Self-pay

## 2021-08-21 ENCOUNTER — Encounter (HOSPITAL_COMMUNITY): Payer: Self-pay | Admitting: Psychiatry

## 2021-08-21 ENCOUNTER — Encounter (HOSPITAL_COMMUNITY): Payer: Self-pay

## 2021-08-21 DIAGNOSIS — F10230 Alcohol dependence with withdrawal, uncomplicated: Secondary | ICD-10-CM | POA: Diagnosis not present

## 2021-08-21 DIAGNOSIS — F1010 Alcohol abuse, uncomplicated: Secondary | ICD-10-CM

## 2021-08-21 DIAGNOSIS — F331 Major depressive disorder, recurrent, moderate: Secondary | ICD-10-CM | POA: Diagnosis not present

## 2021-08-21 DIAGNOSIS — F419 Anxiety disorder, unspecified: Secondary | ICD-10-CM | POA: Diagnosis not present

## 2021-08-21 DIAGNOSIS — Z20822 Contact with and (suspected) exposure to covid-19: Secondary | ICD-10-CM | POA: Diagnosis not present

## 2021-08-21 LAB — CBC WITH DIFFERENTIAL/PLATELET
Abs Immature Granulocytes: 0.03 10*3/uL (ref 0.00–0.07)
Basophils Absolute: 0 10*3/uL (ref 0.0–0.1)
Basophils Relative: 1 %
Eosinophils Absolute: 0.1 10*3/uL (ref 0.0–0.5)
Eosinophils Relative: 2 %
HCT: 44 % (ref 39.0–52.0)
Hemoglobin: 15.3 g/dL (ref 13.0–17.0)
Immature Granulocytes: 0 %
Lymphocytes Relative: 33 %
Lymphs Abs: 2.3 10*3/uL (ref 0.7–4.0)
MCH: 32.3 pg (ref 26.0–34.0)
MCHC: 34.8 g/dL (ref 30.0–36.0)
MCV: 93 fL (ref 80.0–100.0)
Monocytes Absolute: 0.5 10*3/uL (ref 0.1–1.0)
Monocytes Relative: 7 %
Neutro Abs: 4 10*3/uL (ref 1.7–7.7)
Neutrophils Relative %: 57 %
Platelets: 247 10*3/uL (ref 150–400)
RBC: 4.73 MIL/uL (ref 4.22–5.81)
RDW: 12 % (ref 11.5–15.5)
WBC: 6.9 10*3/uL (ref 4.0–10.5)
nRBC: 0 % (ref 0.0–0.2)

## 2021-08-21 LAB — ETHANOL: Alcohol, Ethyl (B): 62 mg/dL — ABNORMAL HIGH (ref <10)

## 2021-08-21 LAB — COMPREHENSIVE METABOLIC PANEL
ALT: 111 U/L — ABNORMAL HIGH (ref 0–44)
AST: 92 U/L — ABNORMAL HIGH (ref 15–41)
Albumin: 4.9 g/dL (ref 3.5–5.0)
Alkaline Phosphatase: 65 U/L (ref 38–126)
Anion gap: 10 (ref 5–15)
BUN: 7 mg/dL (ref 6–20)
CO2: 28 mmol/L (ref 22–32)
Calcium: 10.3 mg/dL (ref 8.9–10.3)
Chloride: 99 mmol/L (ref 98–111)
Creatinine, Ser: 0.83 mg/dL (ref 0.61–1.24)
GFR, Estimated: >60 mL/min (ref >60)
Glucose, Bld: 80 mg/dL (ref 70–99)
Potassium: 3.4 mmol/L — ABNORMAL LOW (ref 3.5–5.1)
Sodium: 137 mmol/L (ref 135–145)
Total Bilirubin: 0.6 mg/dL (ref 0.3–1.2)
Total Protein: 7.8 g/dL (ref 6.5–8.1)

## 2021-08-21 LAB — LIPID PANEL
Cholesterol: 341 mg/dL — ABNORMAL HIGH (ref 0–200)
HDL: 43 mg/dL (ref >40)
LDL Cholesterol: UNDETERMINED mg/dL (ref 0–99)
Total CHOL/HDL Ratio: 7.9 RATIO
Triglycerides: 585 mg/dL — ABNORMAL HIGH (ref <150)
VLDL: UNDETERMINED mg/dL (ref 0–40)

## 2021-08-21 LAB — MAGNESIUM: Magnesium: 2.2 mg/dL (ref 1.7–2.4)

## 2021-08-21 LAB — RESP PANEL BY RT-PCR (FLU A&B, COVID) ARPGX2
Influenza A by PCR: NEGATIVE
Influenza B by PCR: NEGATIVE
SARS Coronavirus 2 by RT PCR: NEGATIVE

## 2021-08-21 LAB — POCT URINE DRUG SCREEN - MANUAL ENTRY (I-SCREEN)
POC Amphetamine UR: NOT DETECTED
POC Buprenorphine (BUP): NOT DETECTED
POC Cocaine UR: NOT DETECTED
POC Marijuana UR: NOT DETECTED
POC Methadone UR: NOT DETECTED
POC Methamphetamine UR: NOT DETECTED
POC Morphine: NOT DETECTED
POC Oxazepam (BZO): POSITIVE — AB
POC Oxycodone UR: NOT DETECTED
POC Secobarbital (BAR): NOT DETECTED

## 2021-08-21 LAB — HEMOGLOBIN A1C
Hgb A1c MFr Bld: 4.9 % (ref 4.8–5.6)
Mean Plasma Glucose: 93.93 mg/dL

## 2021-08-21 LAB — T4, FREE: Free T4: 0.84 ng/dL (ref 0.61–1.12)

## 2021-08-21 LAB — POC SARS CORONAVIRUS 2 AG: SARSCOV2ONAVIRUS 2 AG: NEGATIVE

## 2021-08-21 LAB — TSH: TSH: 10.094 u[IU]/mL — ABNORMAL HIGH (ref 0.350–4.500)

## 2021-08-21 LAB — LDL CHOLESTEROL, DIRECT: Direct LDL: 206 mg/dL — ABNORMAL HIGH (ref 0–99)

## 2021-08-21 MED ORDER — ONDANSETRON 4 MG PO TBDP: 4.0000 mg | ORAL_TABLET | Freq: Four times a day (QID) | ORAL | Status: DC | PRN

## 2021-08-21 MED ORDER — DICYCLOMINE HCL 20 MG PO TABS: 20.0000 mg | ORAL_TABLET | Freq: Four times a day (QID) | ORAL | Status: DC | PRN

## 2021-08-21 MED ORDER — SERTRALINE HCL 50 MG PO TABS
50.0000 mg | ORAL_TABLET | Freq: Every day | ORAL | Status: DC
  Administered 2021-08-21 – 2021-08-23 (×3): 50 mg via ORAL
  Filled 2021-08-21: qty 1
  Filled 2021-08-21: qty 7
  Filled 2021-08-21 (×2): qty 1

## 2021-08-21 MED ORDER — ADULT MULTIVITAMIN W/MINERALS CH
1.0000 | ORAL_TABLET | Freq: Every day | ORAL | Status: DC
  Administered 2021-08-21 – 2021-08-23 (×3): 1 via ORAL
  Filled 2021-08-21 (×3): qty 1

## 2021-08-21 MED ORDER — LORAZEPAM 1 MG PO TABS
1.0000 mg | ORAL_TABLET | Freq: Three times a day (TID) | ORAL | Status: AC
  Administered 2021-08-22 (×3): 1 mg via ORAL
  Filled 2021-08-21 (×3): qty 1

## 2021-08-21 MED ORDER — LORAZEPAM 1 MG PO TABS
1.0000 mg | ORAL_TABLET | Freq: Two times a day (BID) | ORAL | Status: DC
  Administered 2021-08-23: 1 mg via ORAL
  Filled 2021-08-21: qty 1

## 2021-08-21 MED ORDER — ACETAMINOPHEN 325 MG PO TABS
650.0000 mg | ORAL_TABLET | Freq: Four times a day (QID) | ORAL | Status: DC | PRN
  Administered 2021-08-22: 650 mg via ORAL
  Filled 2021-08-21: qty 2

## 2021-08-21 MED ORDER — THIAMINE HCL 100 MG/ML IJ SOLN
100.0000 mg | Freq: Once | INTRAMUSCULAR | Status: AC
  Administered 2021-08-21: 100 mg via INTRAMUSCULAR
  Filled 2021-08-21: qty 2

## 2021-08-21 MED ORDER — THIAMINE HCL 100 MG PO TABS
100.0000 mg | ORAL_TABLET | Freq: Every day | ORAL | Status: DC
  Administered 2021-08-22 – 2021-08-23 (×2): 100 mg via ORAL
  Filled 2021-08-21 (×2): qty 1

## 2021-08-21 MED ORDER — CLONIDINE HCL 0.1 MG PO TABS: 0.1000 mg | ORAL_TABLET | Freq: Every day | ORAL | Status: DC

## 2021-08-21 MED ORDER — CLONIDINE HCL 0.1 MG PO TABS: 0.1000 mg | ORAL_TABLET | ORAL | Status: DC

## 2021-08-21 MED ORDER — NICOTINE 21 MG/24HR TD PT24
21.0000 mg | MEDICATED_PATCH | Freq: Every day | TRANSDERMAL | Status: DC
  Administered 2021-08-21 – 2021-08-22 (×2): 21 mg via TRANSDERMAL
  Filled 2021-08-21 (×3): qty 1

## 2021-08-21 MED ORDER — LOPERAMIDE HCL 2 MG PO CAPS: 2.0000 mg | ORAL_CAPSULE | ORAL | Status: DC | PRN

## 2021-08-21 MED ORDER — METHOCARBAMOL 500 MG PO TABS: 500.0000 mg | ORAL_TABLET | Freq: Three times a day (TID) | ORAL | Status: DC | PRN

## 2021-08-21 MED ORDER — NAPROXEN 500 MG PO TABS: 500.0000 mg | ORAL_TABLET | Freq: Two times a day (BID) | ORAL | Status: DC | PRN

## 2021-08-21 MED ORDER — HYDROXYZINE HCL 25 MG PO TABS: 25.0000 mg | ORAL_TABLET | Freq: Four times a day (QID) | ORAL | Status: DC | PRN

## 2021-08-21 MED ORDER — ALUM & MAG HYDROXIDE-SIMETH 200-200-20 MG/5ML PO SUSP: 30.0000 mL | ORAL | Status: DC | PRN

## 2021-08-21 MED ORDER — LORAZEPAM 1 MG PO TABS
1.0000 mg | ORAL_TABLET | Freq: Four times a day (QID) | ORAL | Status: AC
  Administered 2021-08-21 (×4): 1 mg via ORAL
  Filled 2021-08-21 (×4): qty 1

## 2021-08-21 MED ORDER — LORAZEPAM 1 MG PO TABS: 1.0000 mg | ORAL_TABLET | Freq: Every day | ORAL | Status: DC

## 2021-08-21 MED ORDER — MAGNESIUM HYDROXIDE 400 MG/5ML PO SUSP: 30.0000 mL | Freq: Every day | ORAL | Status: DC | PRN

## 2021-08-21 MED ORDER — CLONIDINE HCL 0.1 MG PO TABS: 0.1000 mg | ORAL_TABLET | Freq: Four times a day (QID) | ORAL | Status: DC

## 2021-08-21 MED ORDER — LORAZEPAM 1 MG PO TABS: 1.0000 mg | ORAL_TABLET | Freq: Four times a day (QID) | ORAL | Status: DC | PRN

## 2021-08-21 NOTE — ED Notes (Signed)
Pt sleeping@this time. Breathing even and unlabored. Will continue to monitor for safety 

## 2021-08-21 NOTE — ED Notes (Signed)
Patient A&Ox4. Patient in dayroom watching TV with his peers appropriately. Denies intent to harm self/others when asked. Denies A/VH. Patient reports mild tremors and mild anxiety when asked. Patient medicated with scheduled medication for withdrawal symptoms. No acute distress noted. Support and encouragement provided. Routine safety checks conducted according to facility protocol. Encouraged patient to notify staff if thoughts of harm toward self or others arise. Patient verbalize understanding and agreement. Will continue to monitor for safety.

## 2021-08-21 NOTE — ED Notes (Signed)
Pt is currently attending AA in the group therapy room

## 2021-08-21 NOTE — Clinical Social Work Psych Note (Incomplete)
LCSW Initial Note  LCSW met with Ryan Mckenzie for introduction and to begin discussions regarding treatment and potential discharge planning.   Ryan Mckenzie presented with a euthymic affect, congruent mood and was pleasant and cooperative with providing information. Ryan Mckenzie denied having any SI, HI or AVH. Ryan Mckenzie did express experiencing some tremors.   Ryan Mckenzie presented to the [facility] seeking assistance with alcohol detox services. Ryan Mckenzie endorsed drinking on average, (7) 40 ounce large beers daily and spends about $300 weekly on alcohol. Ryan Mckenzie shared that he uses alcohol to self medicate his ongoing anxiety and depressive symptoms.   Ryan Mckenzie reports that his main goal is to detox from alcohol safely and follow up with outpatient substance abuse resources for continuity of care. Ryan Mckenzie declined any residential substance treatment services.    Ryan Mckenzie reports he plans to return home with his family. Ryan Mckenzie did express interest in participating in outpatient substance abuse services at discharge.    LCSW will continue to follow for any additional questions, concerns or identified needs.        Radonna Ricker, MSW, LCSW Clinical Education officer, museum ([place]) [facility]

## 2021-08-21 NOTE — BH IP Treatment Plan (Signed)
Interdisciplinary Treatment and Diagnostic Plan Update  08/21/2021 Time of Session: 10:00AM  Ryan Mckenzie MRN: [id number]  Diagnosis:  Final diagnoses:  Alcohol abuse  Anxiety and depression     Current Medications:  Current Facility-Administered Medications  Medication Dose Route Frequency Provider Last Rate Last Admin   acetaminophen (TYLENOL) tablet 650 mg  650 mg Oral Q6H PRN Sindy Guadeloupe, NP       alum & mag hydroxide-simeth (MAALOX/MYLANTA) 200-200-20 MG/5ML suspension 30 mL  30 mL Oral Q4H PRN Sindy Guadeloupe, NP       hydrOXYzine (ATARAX) tablet 25 mg  25 mg Oral Q6H PRN Sindy Guadeloupe, NP       loperamide (IMODIUM) capsule 2-4 mg  2-4 mg Oral PRN Sindy Guadeloupe, NP       LORazepam (ATIVAN) tablet 1 mg  1 mg Oral Q6H PRN Sindy Guadeloupe, NP       LORazepam (ATIVAN) tablet 1 mg  1 mg Oral QID Sindy Guadeloupe, NP   1 mg at 08/21/21 8416   Followed by   Melene Muller ON 08/22/2021] LORazepam (ATIVAN) tablet 1 mg  1 mg Oral TID Sindy Guadeloupe, NP       Followed by   Melene Muller ON 08/23/2021] LORazepam (ATIVAN) tablet 1 mg  1 mg Oral BID Sindy Guadeloupe, NP       Followed by   Melene Muller ON 08/24/2021] LORazepam (ATIVAN) tablet 1 mg  1 mg Oral Daily Sindy Guadeloupe, NP       magnesium hydroxide (MILK OF MAGNESIA) suspension 30 mL  30 mL Oral Daily PRN Sindy Guadeloupe, NP       multivitamin with minerals tablet 1 tablet  1 tablet Oral Daily Sindy Guadeloupe, NP   1 tablet at 08/21/21 6063   nicotine (NICODERM CQ - dosed in mg/24 hours) patch 21 mg  21 mg Transdermal Daily Estella Husk, MD   21 mg at 08/21/21 1111   ondansetron (ZOFRAN-ODT) disintegrating tablet 4 mg  4 mg Oral Q6H PRN Sindy Guadeloupe, NP       sertraline (ZOLOFT) tablet 50 mg  50 mg Oral Daily Estella Husk, MD   50 mg at 08/21/21 1111   [START ON 08/22/2021] thiamine tablet 100 mg  100 mg Oral Daily Sindy Guadeloupe, NP       Current Outpatient Medications  Medication Sig Dispense Refill   ALPRAZolam (XANAX) 0.25 MG tablet Take 0.25 mg by  mouth daily as needed for anxiety (panic attacks).     sertraline (ZOLOFT) 25 MG tablet Take 25 mg by mouth daily.     PTA Medications: Prior to Admission medications   Medication Sig Start Date End Date Taking? Authorizing Provider  ALPRAZolam Prudy Feeler) 0.25 MG tablet Take 0.25 mg by mouth daily as needed for anxiety (panic attacks). 07/05/20  Yes [provider]  sertraline (ZOLOFT) 25 MG tablet Take 25 mg by mouth daily. 06/21/20  Yes [provider]    Patient Stressors: Medication change or noncompliance   Substance abuse    Patient Strengths: Motivation for treatment/growth  Supportive family/friends  Work skills   Treatment Modalities: Medication Management, Group therapy, Case management,  1 to 1 session with clinician, Psychoeducation, Recreational therapy.   Physician Treatment Plan for Primary and Secondary Diagnosis:  Final diagnoses:  Alcohol abuse  Anxiety and depression   Long Term Goal(s): Improvement in symptoms so as ready for discharge  Short Term Goals: Ability to verbalize feelings will improve Ability to disclose and discuss suicidal ideas Ability  to demonstrate self-control will improve Ability to identify and develop effective coping behaviors will improve Ability to maintain clinical measurements within normal limits will improve Compliance with prescribed medications will improve  Medication Management: Evaluate patient's response, side effects, and tolerance of medication regimen.  Therapeutic Interventions: 1 to 1 sessions, Unit Group sessions and Medication administration.  Evaluation of Outcomes: Progressing  RN Treatment Plan for Primary Diagnosis:  Final diagnoses:  Alcohol abuse  Anxiety and depression    Long Term Goal(s): Knowledge of disease and therapeutic regimen to maintain health will improve  Short Term Goals: Ability to identify and develop effective coping behaviors will improve  Medication Management: RN will  administer medications as ordered by provider, will assess and evaluate patient's response and provide education to patient for prescribed medication. RN will report any adverse and/or side effects to prescribing provider.  Therapeutic Interventions: 1 on 1 counseling sessions, Psychoeducation, Medication administration, Evaluate responses to treatment, Monitor vital signs and CBGs as ordered, Perform/monitor CIWA, COWS, AIMS and Fall Risk screenings as ordered, Perform wound care treatments as ordered.  Evaluation of Outcomes: Progressing   LCSW Treatment Plan for Primary Diagnosis:  Final diagnoses:  Alcohol abuse  Anxiety and depression    Long Term Goal(s): Safe transition to appropriate next level of care at discharge, Engage patient in therapeutic group addressing interpersonal concerns.  Short Term Goals: Engage patient in aftercare planning with referrals and resources  Therapeutic Interventions: Assess for all discharge needs, 1 to 1 time with Social worker, Explore available resources and support systems, Assess for adequacy in community support network, Educate family and significant other(s) on suicide prevention, Complete Psychosocial Assessment, Interpersonal group therapy.  Evaluation of Outcomes: Progressing   Progress in Treatment: Attending groups: No. Participating in groups: No. Taking medication as prescribed: Yes. Toleration medication: Yes. Family/Significant other contact made: No, will contact:  no one at this time.  Patient understands diagnosis: Yes. Discussing patient identified problems/goals with staff: Yes. Medical problems stabilized or resolved: Yes. Denies suicidal/homicidal ideation: Yes. Issues/concerns per patient self-inventory: No. Other: None   New problem(s) identified: None   New Short Term/Long Term Goal(s): Patient wants to engage in AA and SA-IOP services following his detox and discharge from the [facility].   Patient Goals:  "I want to  stop drinking and stay away from alcohol all together"   Discharge Plan or Barriers: [name] plans to return home with his family and will follow up with SA-IOP for outpatient therapy services. He also plans to engage in AA meetings for additional services.   Reason for Continuation of Hospitalization: Anxiety Medication stabilization Withdrawal symptoms  Estimated Length of Stay: Discharge is scheduled for Saturday, August 24, 2021.   Last 3 [place] Suicide Severity Risk Score: Flowsheet Row ED from 08/20/2021 in [facility]  C-SSRS RISK CATEGORY No Risk       Last [facility] 2/9 Scores:    08/21/2021    8:52 AM  Depression screen PHQ 2/9  Decreased Interest 2  Down, Depressed, Hopeless 2  PHQ - 2 Score 4  Altered sleeping 1  Tired, decreased energy 1  Change in appetite 2  Feeling bad or failure about yourself  1  Trouble concentrating 2  Moving slowly or fidgety/restless 2  Suicidal thoughts 0  PHQ-9 Score 13  Difficult doing work/chores Somewhat difficult    Scribe for Treatment Team: Maeola Sarah, LCSW 08/21/2021 11:42 AM

## 2021-08-21 NOTE — ED Notes (Signed)
Patient A&Ox4. Denies intent to harm self/others when asked. Denies A/VH. Patient denies any physical complaints when asked. No acute distress noted. Support and encouragement provided. Routine safety checks conducted according to facility protocol. Encouraged patient to notify staff if thoughts of harm toward self or others arise. Patient verbalize understanding and agreement. Will continue to monitor for safety.    

## 2021-08-21 NOTE — ED Notes (Signed)
[facility] lab contacted at 10:48 am and informed of add on labs. Technician informed me that she would have to located blood to see if it was enough. She also reported that she would call me back and let me know. Dr. Laubach informed.  

## 2021-08-21 NOTE — ED Provider Notes (Signed)
Facility Based Crisis Admission H&P  Date: 08/21/21 Patient Name: Ryan Mckenzie MRN: [id number] Chief Complaint:  Chief Complaint  Patient presents with   Alcohol Problem      Diagnoses:  Final diagnoses:  Alcohol abuse  Anxiety and depression    HPI: Ryan Mckenzie,  26 yrs old male present to [facility] with his mother,  seeking alcohol detox.  Per the patient he has been drinking since age 45.  He drinks betten 5-7  (40 OZ) beer each day.  Pt reports he spend up to $300 per/wk on beers.  Today he had 2 beers.  Per the patient he is tired and want to stop drinking.  Patient states he sees his primary care physician for anxiety and depression and is prescribed Zoloft 25 mg daily.  Copied from TTS notes,  Ryan Mckenzie is a 26 year old male presenting as a voluntary walk-in to [facility] requesting alcohol detox. Patient denied SI, HI and psychosis. Patient started drinking at the age of 59. Patient reported drinking approx 7x 40 ounce large beers daily and spends approx $300 weekly on alcohol. Patient reported last drink was today, 2x large 40 ounce beers. Patient reported stressors include, money, paying bills "getting paid and then being broke by the end of the week". 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 Patient reported worsening depressive symptoms. Patient denied prior psych hospitalizations, suicide attempts and self-harming behaviors. Patient denied receiving any outpatient mental health services. Patient is currently prescribed Zoloft by his PCP. Patient reported psych medications are not working that well. Patient resides with mother and 64 year old brother. Patient reported no family conflict. Patient was calm and cooperative during assessment. Patient is urgent.   Observation of patient, he is alert and oriented x4 speech is clear, mood is anxious affect congruent with mood.  Patient denies SI, HI, AVH,  paranoia.  Patient reports he drinks 7 beers daily (40 oz).  Patient stated I binge drink at times, patient denies smoking or illicit drug use per the patient he only very.  Patient reports he gets 4 to 5 hours of sleep each night, denies any psychiatric diagnosis or seeing a therapist.  Discussed with patient the commitment involved in detox, patient verbalized understanding.  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   Will admit patient to [facility]  PHQ 2-9:   Flowsheet Row ED from 08/20/2021 in [facility]  C-SSRS RISK CATEGORY No Risk        Total Time spent with patient: 20 minutes  Musculoskeletal  Strength & Muscle Tone: within normal limits Gait & Station: normal Patient leans: N/A  Psychiatric Specialty Exam  Presentation General Appearance: Appropriate for Environment  Eye Contact:Fair  Speech:Clear and Coherent  Speech Volume:Normal  Handedness:Ambidextrous   Mood and Affect  Mood:Anxious; Hopeless; Worthless  Affect:Appropriate   Art gallery manager Processes:Coherent  Descriptions of Associations:Circumstantial  Orientation:Full (Time, Place and Person)  Thought Content:Abstract Reasoning    Hallucinations:Hallucinations: None  Ideas of Reference:None  Suicidal Thoughts:Suicidal Thoughts: No  Homicidal Thoughts:Homicidal Thoughts: No   Sensorium  Memory:Immediate Fair  Judgment:Fair  Insight:Fair   Executive Functions  Concentration:Fair  Attention Span:Fair  Recall:Fair  Fund of Knowledge:Fair  Language:Fair   Psychomotor Activity  Psychomotor Activity:Psychomotor Activity: Normal   Assets  Assets:Desire for Improvement   Sleep  Sleep:Sleep: Fair Number of  Hours of Sleep: 5   Nutritional Assessment (For OBS and FBC admissions only) Has the patient had a weight loss or gain of 10 pounds or more in the last 3 months?: No Has the patient had a decrease  in food intake/or appetite?: No Does the patient have dental problems?: No Does the patient have eating habits or behaviors that may be indicators of an eating disorder including binging or inducing vomiting?: No Has the patient recently lost weight without trying?: 0    Physical Exam HENT:     Head: Normocephalic.     Nose: Nose normal.  Cardiovascular:     Rate and Rhythm: Normal rate.  Pulmonary:     Effort: Pulmonary effort is normal.  Musculoskeletal:        General: Normal range of motion.     Cervical back: Normal range of motion.  Skin:    General: Skin is warm.  Neurological:     General: No focal deficit present.     Mental Status: He is alert.  Psychiatric:        Mood and Affect: Mood normal.        Judgment: Judgment normal.   Review of Systems  Constitutional: Negative.   HENT: Negative.    Eyes: Negative.   Respiratory: Negative.    Cardiovascular: Negative.  Negative for chest pain.  Gastrointestinal: Negative.   Genitourinary: Negative.   Musculoskeletal: Negative.   Skin: Negative.   Neurological: Negative.   Endo/Heme/Allergies: Negative.   Psychiatric/Behavioral:  Positive for substance abuse. The patient is nervous/anxious.    Blood pressure 127/84, pulse 86, temperature 98.7 F (37.1 C), temperature source Tympanic, resp. rate 18, SpO2 99 %. There is no height or weight on file to calculate BMI.  Past Psychiatric History: anxiety and depression   Is the patient at risk to self? No  Has the patient been a risk to self in the past 6 months? No .    Has the patient been a risk to self within the distant past? No   Is the patient a risk to others? No   Has the patient been a risk to others in the past 6 months? No   Has the patient been a risk to others within the distant past? No   Past Medical History: No past medical history on file. No past surgical history on file.  Family History:  Family History  Problem Relation Age of Onset   COPD  Mother    Healthy Brother     Social History:  Social History   Socioeconomic History   Marital status: Single    Spouse name: Not on file   Number of children: Not on file   Years of education: Not on file   Highest education level: Not on file  Occupational History   Not on file  Tobacco Use   Smoking status: Passive Smoke Exposure - Never Smoker   Smokeless tobacco: Never  Vaping Use   Vaping Use: Every day  Substance and Sexual Activity   Alcohol use: No   Drug use: No   Sexual activity: Never    Birth control/protection: Abstinence  Other Topics Concern   Not on file  Social History Narrative   Not on file   Social Determinants of Health   Financial Resource Strain: Not on file  Food Insecurity: Not on file  Transportation Needs: Not on file  Physical Activity: Not on file  Stress: Not on file  Social Connections: Not  on file  Intimate Partner Violence: Not on file    SDOH:  SDOH Screenings   Alcohol Screen: Not on file  Depression (PHQ2-9): Not on file  Financial Resource Strain: Not on file  Food Insecurity: Not on file  Housing: Not on file  Physical Activity: Not on file  Social Connections: Not on file  Stress: Not on file  Tobacco Use: Not on file  Transportation Needs: Not on file    Last Labs:  Admission on 08/20/2021  Component Date Value Ref Range Status   SARS Coronavirus 2 by RT PCR 08/21/2021 NEGATIVE  NEGATIVE Final   Comment: (NOTE) SARS-CoV-2 target nucleic acids are NOT DETECTED.  The SARS-CoV-2 RNA is generally detectable in upper respiratory specimens during the acute phase of infection. The lowest concentration of SARS-CoV-2 viral copies this assay can detect is 138 copies/mL. A negative result does not preclude SARS-Cov-2 infection and should not be used as the sole basis for treatment or other patient management decisions. A negative result may occur with  improper specimen collection/handling, submission of specimen  other than nasopharyngeal swab, presence of viral mutation(s) within the areas targeted by this assay, and inadequate number of viral copies(<138 copies/mL). A negative result must be combined with clinical observations, patient history, and epidemiological information. The expected result is Negative.  Fact Sheet for Patients:  BloggerCourse.comhttps://www.fda.gov/media/152166/download  Fact Sheet for Healthcare Providers:  SeriousBroker.ithttps://www.fda.gov/media/152162/download  This test is no                          t yet approved or cleared by the [place] FDA and  has been authorized for detection and/or diagnosis of SARS-CoV-2 by FDA under an Emergency Use Authorization (EUA). This EUA will remain  in effect (meaning this test can be used) for the duration of the COVID-19 declaration under Section 564(b)(1) of the Act, 21 U.S.C.section 360bbb-3(b)(1), unless the authorization is terminated  or revoked sooner.       Influenza A by PCR 08/21/2021 NEGATIVE  NEGATIVE Final   Influenza B by PCR 08/21/2021 NEGATIVE  NEGATIVE Final   Comment: (NOTE) The Xpert Xpress SARS-CoV-2/FLU/RSV plus assay is intended as an aid in the diagnosis of influenza from Nasopharyngeal swab specimens and should not be used as a sole basis for treatment. Nasal washings and aspirates are unacceptable for Xpert Xpress SARS-CoV-2/FLU/RSV testing.  Fact Sheet for Patients: BloggerCourse.comhttps://www.fda.gov/media/152166/download  Fact Sheet for Healthcare Providers: SeriousBroker.ithttps://www.fda.gov/media/152162/download  This test is not yet approved or cleared by the [place] FDA and has been authorized for detection and/or diagnosis of SARS-CoV-2 by FDA under an Emergency Use Authorization (EUA). This EUA will remain in effect (meaning this test can be used) for the duration of the COVID-19 declaration under Section 564(b)(1) of the Act, 21 U.S.C. section 360bbb-3(b)(1), unless the authorization is terminated or revoked.  Performed at  [facility], [address] [place], [place] [id number]    WBC 08/21/2021 6.9  4.0 - 10.5 K/uL Final   RBC 08/21/2021 4.73  4.22 - 5.81 MIL/uL Final   Hemoglobin 08/21/2021 15.3  13.0 - 17.0 g/dL Final   HCT 19/14/782905/31/2023 44.0  39.0 - 52.0 % Final   MCV 08/21/2021 93.0  80.0 - 100.0 fL Final   MCH 08/21/2021 32.3  26.0 - 34.0 pg Final   MCHC 08/21/2021 34.8  30.0 - 36.0 g/dL Final   RDW 56/21/308605/31/2023 12.0  11.5 - 15.5 % Final   Platelets 08/21/2021 247  150 - 400 K/uL Final   nRBC 08/21/2021 0.0  0.0 - 0.2 % Final   Neutrophils Relative % 08/21/2021 57  % Final   Neutro Abs 08/21/2021 4.0  1.7 - 7.7 K/uL Final   Lymphocytes Relative 08/21/2021 33  % Final   Lymphs Abs 08/21/2021 2.3  0.7 - 4.0 K/uL Final   Monocytes Relative 08/21/2021 7  % Final   Monocytes Absolute 08/21/2021 0.5  0.1 - 1.0 K/uL Final   Eosinophils Relative 08/21/2021 2  % Final   Eosinophils Absolute 08/21/2021 0.1  0.0 - 0.5 K/uL Final   Basophils Relative 08/21/2021 1  % Final   Basophils Absolute 08/21/2021 0.0  0.0 - 0.1 K/uL Final   Immature Granulocytes 08/21/2021 0  % Final   Abs Immature Granulocytes 08/21/2021 0.03  0.00 - 0.07 K/uL Final   Performed at [facility], [address] [place], [place] [address]   Sodium 08/21/2021 137  135 - 145 mmol/L Final   Potassium 08/21/2021 3.4 (L)  3.5 - 5.1 mmol/L Final   Chloride 08/21/2021 99  98 - 111 mmol/L Final   CO2 08/21/2021 28  22 - 32 mmol/L Final   Glucose, Bld 08/21/2021 80  70 - 99 mg/dL Final   Glucose reference range applies only to samples taken after fasting for at least 8 hours.   BUN 08/21/2021 7  6 - 20 mg/dL Final   Creatinine, Ser 08/21/2021 0.83  0.61 - 1.24 mg/dL Final   Calcium 19/14/7829 10.3  8.9 - 10.3 mg/dL Final   Total Protein 56/21/3086 7.8  6.5 - 8.1 g/dL Final   Albumin 57/84/6962 4.9  3.5 - 5.0 g/dL Final   AST 95/28/4132 92 (H)  15 - 41 U/L Final   ALT 08/21/2021 111 (H)  0 - 44 U/L Final   Alkaline Phosphatase  08/21/2021 65  38 - 126 U/L Final   Total Bilirubin 08/21/2021 0.6  0.3 - 1.2 mg/dL Final   GFR, Estimated 08/21/2021 >60  >60 mL/min Final   Comment: (NOTE) Calculated using the CKD-EPI Creatinine Equation (2021)    Anion gap 08/21/2021 10  5 - 15 Final   Performed at [facility], [address], [place], [place] [address]   Hgb A1c MFr Bld 08/21/2021 4.9  4.8 - 5.6 % Final   Comment: (NOTE) Pre diabetes:          5.7%-6.4%  Diabetes:              >6.4%  Glycemic control for   <7.0% adults with diabetes    Mean Plasma Glucose 08/21/2021 93.93  mg/dL Final   Performed at [facility], [address], [place], [place] [address]   Magnesium 08/21/2021 2.2  1.7 - 2.4 mg/dL Final   Performed at [facility], [address] [place], [place] [address]   Alcohol, Ethyl (B) 08/21/2021 62 (H)  <10 mg/dL Final   Comment: (NOTE) Lowest detectable limit for serum alcohol is 10 mg/dL.  For medical purposes only. Performed at [facility], [address], [place], [place] [address]    Cholesterol 08/21/2021 341 (H)  0 - 200 mg/dL Final   Triglycerides 59/56/3875 585 (H)  <150 mg/dL Final   HDL 64/33/2951 43  >40 mg/dL Final   Total CHOL/HDL Ratio 08/21/2021 7.9  RATIO Final   VLDL 08/21/2021 UNABLE TO CALCULATE IF TRIGLYCERIDE OVER 400 mg/dL  0 - 40 mg/dL Final  LDL Cholesterol 08/21/2021 UNABLE TO CALCULATE IF TRIGLYCERIDE OVER 400 mg/dL  0 - 99 mg/dL Final   Comment:        Total Cholesterol/HDL:CHD Risk Coronary Heart Disease Risk Table                     Men   Women  1/2 Average Risk   3.4   3.3  Average Risk       5.0   4.4  2 X Average Risk   9.6   7.1  3 X Average Risk  23.4   11.0        Use the calculated Patient Ratio above and the CHD Risk Table to determine the patient's CHD Risk.        ATP III CLASSIFICATION (LDL):  <100     mg/dL   Optimal  017-494  mg/dL   Near or Above                    Optimal  130-159  mg/dL   Borderline  496-759   mg/dL   High  >163     mg/dL   Very High Performed at [facility], [address] [place], [place] [address]    TSH 08/21/2021 10.094 (H)  0.350 - 4.500 uIU/mL Final   Comment: Performed by a 3rd Generation assay with a functional sensitivity of <=0.01 uIU/mL. Performed at [facility], [address] [place], [place] [address]    POC Amphetamine UR 08/21/2021 None Detected  NONE DETECTED (Cut Off Level 1000 ng/mL) Final   POC Secobarbital (BAR) 08/21/2021 None Detected  NONE DETECTED (Cut Off Level 300 ng/mL) Final   POC Buprenorphine (BUP) 08/21/2021 None Detected  NONE DETECTED (Cut Off Level 10 ng/mL) Final   POC Oxazepam (BZO) 08/21/2021 Positive (A)  NONE DETECTED (Cut Off Level 300 ng/mL) Final   POC Cocaine UR 08/21/2021 None Detected  NONE DETECTED (Cut Off Level 300 ng/mL) Final   POC Methamphetamine UR 08/21/2021 None Detected  NONE DETECTED (Cut Off Level 1000 ng/mL) Final   POC Morphine 08/21/2021 None Detected  NONE DETECTED (Cut Off Level 300 ng/mL) Final   POC Methadone UR 08/21/2021 None Detected  NONE DETECTED (Cut Off Level 300 ng/mL) Final   POC Oxycodone UR 08/21/2021 None Detected  NONE DETECTED (Cut Off Level 100 ng/mL) Final   POC Marijuana UR 08/21/2021 None Detected  NONE DETECTED (Cut Off Level 50 ng/mL) Final   SARSCOV2ONAVIRUS 2 AG 08/21/2021 NEGATIVE  NEGATIVE Final   Comment: (NOTE) SARS-CoV-2 antigen NOT DETECTED.   Negative results are presumptive.  Negative results do not preclude SARS-CoV-2 infection and should not be used as the sole basis for treatment or other patient management decisions, including infection  control decisions, particularly in the presence of clinical signs and  symptoms consistent with COVID-19, or in those who have been in contact with the virus.  Negative results must be combined with clinical observations, patient history, and epidemiological information. The expected result is Negative.  Fact Sheet for  Patients: https://www.jennings-kim.com/  Fact Sheet for Healthcare Providers: https://alexander-rogers.biz/  This test is not yet approved or cleared by the [place] FDA and  has been authorized for detection and/or diagnosis of SARS-CoV-2 by FDA under an Emergency Use Authorization (EUA).  This EUA will remain in effect (meaning this test can be used) for the duration of  the COV  ID-19 declaration under Section 564(b)(1) of the Act, 21 U.S.C. section 360bbb-3(b)(1), unless the authorization is terminated or revoked sooner.     Direct LDL 08/21/2021 206.0 (H)  0 - 99 mg/dL Final   Performed at [facility], [address] [place], [place] [address]    Allergies: Patient has no known allergies.  PTA Medications: (Not in a hospital admission)   Long Term Goals: Improvement in symptoms so as ready for discharge  Short Term Goals: Ability to verbalize feelings will improve, Ability to disclose and discuss suicidal ideas, Ability to demonstrate self-control will improve, Ability to identify and develop effective coping behaviors will improve, Ability to maintain clinical measurements within normal limits will improve, and Compliance with prescribed medications will improve  Medical Decision Making  Inpatient FBC   Lab Orders         Resp Panel by RT-PCR (Flu A&B, Covid) Anterior Nasal Swab         CBC with Differential/Platelet         Comprehensive metabolic panel         Hemoglobin A1c         Magnesium         Ethanol         Lipid panel         TSH         LDL cholesterol, direct         POCT Urine Drug Screen - (I-Screen)         POC SARS Coronavirus 2 Ag      Meds ordered this encounter  Medications   acetaminophen (TYLENOL) tablet 650 mg   alum & mag hydroxide-simeth (MAALOX/MYLANTA) 200-200-20 MG/5ML suspension 30 mL   magnesium hydroxide (MILK OF MAGNESIA) suspension 30 mL   dicyclomine (BENTYL) tablet  20 mg   methocarbamol (ROBAXIN) tablet 500 mg   naproxen (NAPROSYN) tablet 500 mg   thiamine (B-1) injection 100 mg   thiamine tablet 100 mg   multivitamin with minerals tablet 1 tablet   LORazepam (ATIVAN) tablet 1 mg   hydrOXYzine (ATARAX) tablet 25 mg   loperamide (IMODIUM) capsule 2-4 mg   ondansetron (ZOFRAN-ODT) disintegrating tablet 4 mg   FOLLOWED BY Linked Order Group    cloNIDine (CATAPRES) tablet 0.1 mg    cloNIDine (CATAPRES) tablet 0.1 mg    cloNIDine (CATAPRES) tablet 0.1 mg   FOLLOWED BY Linked Order Group    LORazepam (ATIVAN) tablet 1 mg    LORazepam (ATIVAN) tablet 1 mg    LORazepam (ATIVAN) tablet 1 mg    LORazepam (ATIVAN) tablet 1 mg     Recommendations  Based on my evaluation the patient does not appear to have an emergency medical condition.  Sindy Guadeloupe, NP 08/21/21  5:48 AM

## 2021-08-21 NOTE — ED Provider Notes (Signed)
Behavioral Health Progress Note  Date and Time: 08/21/2021 11:39 AM Name: Ryan Mckenzie MRN:  [id number]  Subjective:      26 year old male with history of alcohol use, anxiety and depression who presented to the [facility] accompanied by his mother seeking alcohol detox on 08/21/2021.  Patient reported that he been drinking 5 to 7 40 ounce beers a day since he was 26 years old.  Patient was admitted to the [facility] for alcohol detox and crisis stabilization and started on Ativan taper and CIWA protocol.  EtOH 62; UDS + benzodiazepines.  Patient interviewed in conjunction with LCSW this morning.  Patient describes his mood as "I am okay, a little shaky".  Patient goes on to state that he feels that he is shaky because he has been unable to smoke since he has been in the [facility] and requests a nicotine patch.  Patient states that his goal in coming to the [facility] is to "stop drinking" and states that he has been consuming 5-6 tall boys daily and will consume twice that amount on the weekends.  Patient states that his relationships and his job have been negatively affected by his drinking.      Patient states that last year he began to experience depression, anxiety and panic attacks and began to self-medicate with alcohol.  Patient states that his current medication of Zoloft works well but states that he feels that the dose needs to be increased- "Zoloft works great for me".  Discussed increasing Zoloft dose from 25 mg to 50 mg-patient is in agreement.Patient states that he is interested in therapy services upon discharge and states that he had been considering therapy until his local primary care physician closed and he did not know where to go for resources.  Patient states that he had previously attempted to stop drinking and when he did he began to have increased anxiety, panic attacks and headaches.  Patient states he stopped for approximately 6 days before he began using alcohol again. No h/o of Dts or etoh withdrawal  seizures.   Patient denies SI/HI/AVH.  Patient reports sleeping "okay" and states his appetite is "okay".  Overall he states that he has had a decrease in appetite but denies any changes in weight.  He describes his mood as anxious.  He denies alcohol withdrawal symptoms of nausea, vomiting, GI upset.  He reports tremors, diaphoresis and anxiety.  He states stressors mostly being financial in nature and states that he spends up to $300 a week purchasing alcohol.  Discussed the plan to increase Zoloft to 50 mg and to start nicotine patch patient is in agreement.  Educated patient on CIWA protocol and Ativan taper.  Informed patient that his Ativan taper is currently sent to end on Saturday, 08/24/2021.  Patient verbalized understanding and is currently in agreement to remain in the [facility] for remainder of detox.  Patient states that he would like a letter for work for him to return on Monday.   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 , has been employed ~7 months   Housing Status: with mother and 78 yo brother Easy access to gun: denies  Substance Use (with emphasis over the last 12 months) Recreational Drugs: denied  Use of Alcohol: heavy Tobacco Use: yes Rehab History: no H/O Complicated Withdrawal: no  Legal History: Past Charges/Incarcerations: denied Pending charges: denied  Family Psychiatric History: Mother with anxiety Denies substance use history Maternal great grandfather completed suicide     Diagnosis:  Final diagnoses:  Alcohol abuse  Anxiety and depression    Total Time spent with patient: 30 minutes  Past  Psychiatric History: anxiety, depression, alcohol use Past Medical History: History reviewed. No pertinent past medical history. History reviewed. No pertinent surgical history. Family History:  Family History  Problem Relation Age of Onset   COPD Mother    Healthy Brother    Family Psychiatric  History:  Mother with anxiety Denies substance use history Maternal great grandfather completed suicide Social History:  Social History   Substance and Sexual Activity  Alcohol Use No     Social History   Substance and Sexual Activity  Drug Use No    Social History   Socioeconomic History   Marital status: Single    Spouse name: Not on file   Number of children: Not on file   Years of education: Not on file   Highest education level: Not on file  Occupational History   Not on file  Tobacco Use   Smoking status: Passive Smoke Exposure - Never Smoker   Smokeless tobacco: Never  Vaping Use   Vaping Use: Every day  Substance and Sexual Activity   Alcohol use: No   Drug use: No   Sexual activity: Never    Birth control/protection: Abstinence  Other Topics Concern   Not on file  Social History Narrative   Not on file   Social Determinants of Health   Financial Resource Strain: Not on file  Food Insecurity: Not on file  Transportation Needs: Not on file  Physical Activity: Not on file  Stress: Not on file  Social Connections: Not on file   SDOH:  SDOH Screenings   Alcohol Screen: Not on file  Depression (PHQ2-9): Medium Risk   PHQ-2 Score: 13  Financial Resource Strain: Not on file  Food Insecurity: Not on file  Housing: Not on file  Physical Activity: Not on file  Social Connections: Not on file  Stress: Not on file  Tobacco Use: Medium Risk   Smoking Tobacco Use: Passive Smoke Exposure - Never Smoker   Smokeless Tobacco Use: Never   Passive Exposure: Yes  Transportation Needs: Not on file   Additional Social History:    Pain Medications: see  MAR Prescriptions: see MAR Over the Counter: see MAR History of alcohol / drug use?: Yes Name of Substance 1: alcohol 1 - Age of First Use: 22 1 - Amount (size/oz): 7x 40 ounces 1 - Frequency: daily 1 - Last Use / Amount: today                  Sleep: Fair  Appetite:   overall decreased, but ok since admitted to [facility]  Current Medications:  Current Facility-Administered Medications  Medication Dose Route Frequency Provider Last Rate Last Admin   acetaminophen (TYLENOL) tablet 650 mg  650 mg Oral Q6H PRN Evette Georges, NP       alum & mag hydroxide-simeth (MAALOX/MYLANTA) 200-200-20 MG/5ML suspension 30 mL  30 mL Oral Q4H PRN Evette Georges, NP       hydrOXYzine (ATARAX) tablet 25 mg  25 mg Oral Q6H PRN Evette Georges, NP       loperamide (IMODIUM) capsule 2-4 mg  2-4 mg  Oral PRN Evette Georges, NP       LORazepam (ATIVAN) tablet 1 mg  1 mg Oral Q6H PRN Evette Georges, NP       LORazepam (ATIVAN) tablet 1 mg  1 mg Oral QID Evette Georges, NP   1 mg at 08/21/21 [id number]   Followed by   Derrill Memo ON 08/22/2021] LORazepam (ATIVAN) tablet 1 mg  1 mg Oral TID Evette Georges, NP       Followed by   Derrill Memo ON 08/23/2021] LORazepam (ATIVAN) tablet 1 mg  1 mg Oral BID Evette Georges, NP       Followed by   Derrill Memo ON 08/24/2021] LORazepam (ATIVAN) tablet 1 mg  1 mg Oral Daily Evette Georges, NP       magnesium hydroxide (MILK OF MAGNESIA) suspension 30 mL  30 mL Oral Daily PRN Evette Georges, NP       multivitamin with minerals tablet 1 tablet  1 tablet Oral Daily Evette Georges, NP   1 tablet at 08/21/21 [id number]   nicotine (NICODERM CQ - dosed in mg/24 hours) patch 21 mg  21 mg Transdermal Daily Ival Bible, MD   21 mg at 08/21/21 1111   ondansetron (ZOFRAN-ODT) disintegrating tablet 4 mg  4 mg Oral Q6H PRN Evette Georges, NP       sertraline (ZOLOFT) tablet 50 mg  50 mg Oral Daily Ival Bible, MD   50 mg at 08/21/21 1111   [START ON 08/22/2021] thiamine tablet 100 mg  100 mg Oral Daily Evette Georges, NP       Current Outpatient Medications  Medication Sig Dispense Refill   ALPRAZolam (XANAX) 0.25 MG tablet Take 0.25 mg by mouth daily as needed for anxiety (panic attacks).     sertraline (ZOLOFT) 25 MG tablet Take 25 mg by mouth daily.      Labs  Lab Results:  Admission on 08/20/2021  Component Date Value Ref Range Status   SARS Coronavirus 2 by RT PCR 08/21/2021 NEGATIVE  NEGATIVE Final   Comment: (NOTE) SARS-CoV-2 target nucleic acids are NOT DETECTED.  The SARS-CoV-2 RNA is generally detectable in upper respiratory specimens during the acute phase of infection. The lowest concentration of SARS-CoV-2 viral copies this assay can detect is 138 copies/mL. A negative result does not preclude SARS-Cov-2 infection and should not be used as the sole basis for treatment or other patient management decisions. A negative result may occur with  improper specimen collection/handling, submission of specimen other than nasopharyngeal swab, presence of viral mutation(s) within the areas targeted by this assay, and inadequate number of viral copies(<138 copies/mL). A negative result must be combined with clinical observations, patient history, and epidemiological information. The expected result is Negative.  Fact Sheet for Patients:  EntrepreneurPulse.com.au  Fact Sheet for Healthcare Providers:  IncredibleEmployment.be  This test is no                          t yet approved or cleared by the [place] FDA and  has been authorized for detection and/or diagnosis of SARS-CoV-2 by FDA under an Emergency Use Authorization (EUA). This EUA will remain  in effect (meaning this test can be used) for the duration of the COVID-19 declaration under Section 564(b)(1) of the Act, 21 U.S.C.section 360bbb-3(b)(1), unless the authorization is terminated  or revoked sooner.       Influenza A by PCR 08/21/2021 NEGATIVE  NEGATIVE Final   Influenza B by  PCR 08/21/2021 NEGATIVE  NEGATIVE Final   Comment: (NOTE) The Xpert Xpress SARS-CoV-2/FLU/RSV plus assay is intended as an aid in the diagnosis of influenza from Nasopharyngeal swab specimens and should not be used as a sole basis for treatment. Nasal washings and aspirates are unacceptable for Xpert Xpress SARS-CoV-2/FLU/RSV testing.  Fact Sheet for Patients: EntrepreneurPulse.com.au  Fact Sheet for Healthcare Providers: IncredibleEmployment.be  This test is not yet approved or cleared by the [place] FDA and has been authorized for detection and/or diagnosis of SARS-CoV-2 by FDA under an Emergency Use Authorization (EUA). This EUA will remain in effect (meaning this test can be used) for the duration of the COVID-19 declaration under Section 564(b)(1) of the Act, 21 U.S.C. section 360bbb-3(b)(1), unless the authorization is terminated or revoked.  Performed at [facility], [place] [address] [place], [place] [address]    WBC 08/21/2021 6.9  4.0 - 10.5 K/uL Final   RBC 08/21/2021 4.73  4.22 - 5.81 MIL/uL Final   Hemoglobin 08/21/2021 15.3  13.0 - 17.0 g/dL Final   HCT 08/21/2021 44.0  39.0 - 52.0 % Final   MCV 08/21/2021 93.0  80.0 - 100.0 fL Final   MCH 08/21/2021 32.3  26.0 - 34.0 pg Final   MCHC 08/21/2021 34.8  30.0 - 36.0 g/dL Final   RDW 08/21/2021 12.0  11.5 - 15.5 % Final   Platelets 08/21/2021 247  150 - 400 K/uL Final   nRBC 08/21/2021 0.0  0.0 - 0.2 % Final   Neutrophils Relative % 08/21/2021 57  % Final   Neutro Abs 08/21/2021 4.0  1.7 - 7.7 K/uL Final   Lymphocytes Relative 08/21/2021 33  % Final   Lymphs Abs 08/21/2021 2.3  0.7 - 4.0 K/uL Final   Monocytes Relative 08/21/2021 7  % Final   Monocytes Absolute 08/21/2021 0.5  0.1 - 1.0 K/uL Final   Eosinophils Relative 08/21/2021 2  % Final   Eosinophils Absolute 08/21/2021 0.1  0.0 - 0.5 K/uL Final   Basophils Relative 08/21/2021 1  % Final   Basophils Absolute  08/21/2021 0.0  0.0 - 0.1 K/uL Final   Immature Granulocytes 08/21/2021 0  % Final   Abs Immature Granulocytes 08/21/2021 0.03  0.00 - 0.07 K/uL Final   Performed at [facility], [place] [address] [place], [place] [address]   Sodium 08/21/2021 137  135 - 145 mmol/L Final   Potassium 08/21/2021 3.4 (L)  3.5 - 5.1 mmol/L Final   Chloride 08/21/2021 99  98 - 111 mmol/L Final   CO2 08/21/2021 28  22 - 32 mmol/L Final   Glucose, Bld 08/21/2021 80  70 - 99 mg/dL Final   Glucose reference range applies only to samples taken after fasting for at least 8 hours.   BUN 08/21/2021 7  6 - 20 mg/dL Final   Creatinine, Ser 08/21/2021 0.83  0.61 - 1.24 mg/dL Final   Calcium 08/21/2021 10.3  8.9 - 10.3 mg/dL Final   Total Protein 08/21/2021 7.8  6.5 - 8.1 g/dL Final   Albumin 08/21/2021 4.9  3.5 - 5.0 g/dL Final   AST 08/21/2021 92 (H)  15 - 41 U/L Final   ALT 08/21/2021 111 (H)  0 - 44 U/L Final   Alkaline Phosphatase 08/21/2021 65  38 - 126 U/L Final   Total Bilirubin 08/21/2021 0.6  0.3 - 1.2 mg/dL Final   GFR, Estimated 08/21/2021 >60  >60 mL/min Final   Comment: (NOTE) Calculated using the CKD-EPI Creatinine  Equation (2021)    Anion gap 08/21/2021 10  5 - 15 Final   Performed at [facility], [place] [address], [place], [place] [address]   Hgb A1c MFr Bld 08/21/2021 4.9  4.8 - 5.6 % Final   Comment: (NOTE) Pre diabetes:          5.7%-6.4%  Diabetes:              >6.4%  Glycemic control for   <7.0% adults with diabetes    Mean Plasma Glucose 08/21/2021 93.93  mg/dL Final   Performed at [facility], [place] [address] [place], [place] [address]   Magnesium 08/21/2021 2.2  1.7 - 2.4 mg/dL Final   Performed at [facility] [address], [place], [place] [address]   Alcohol, Ethyl (B) 08/21/2021 62 (H)  <10 mg/dL Final   Comment: (NOTE) Lowest detectable limit for serum alcohol is 10 mg/dL.  For medical purposes only. Performed at [facility], [place] [address] [place], [place] [address]    Cholesterol 08/21/2021 341 (H)  0 - 200 mg/dL Final   Triglycerides 08/21/2021 585 (H)  <150 mg/dL Final   HDL 08/21/2021 43  >40 mg/dL Final   Total CHOL/HDL Ratio 08/21/2021 7.9  RATIO Final   VLDL 08/21/2021 UNABLE TO CALCULATE IF TRIGLYCERIDE OVER 400 mg/dL  0 - 40 mg/dL Final   LDL Cholesterol 08/21/2021 UNABLE TO CALCULATE IF TRIGLYCERIDE OVER 400 mg/dL  0 - 99 mg/dL Final   Comment:        Total Cholesterol/HDL:CHD Risk Coronary Heart Disease Risk Table                     Men   Women  1/2 Average Risk   3.4   3.3  Average Risk       5.0   4.4  2 X Average Risk   9.6   7.1  3 X Average Risk  23.4   11.0        Use the calculated Patient Ratio above and the CHD Risk Table to determine the patient's CHD Risk.        ATP III CLASSIFICATION (LDL):  <100     mg/dL   Optimal  100-129  mg/dL   Near or Above                    Optimal  130-159  mg/dL   Borderline  160-189  mg/dL   High  >190     mg/dL   Very High Performed at [facility] [address] [place], [place] [address]    TSH 08/21/2021 10.094 (H)  0.350 - 4.500 uIU/mL Final   Comment: Performed by a 3rd Generation assay with a functional sensitivity of <=0.01 uIU/mL. Performed at [facility], [place] [address] [place], [place] [address]    POC Amphetamine UR 08/21/2021 None Detected  NONE DETECTED (Cut Off Level 1000 ng/mL) Final   POC Secobarbital (BAR) 08/21/2021 None Detected  NONE DETECTED (Cut Off Level 300 ng/mL) Final   POC Buprenorphine (BUP) 08/21/2021 None Detected  NONE DETECTED (Cut Off Level 10 ng/mL) Final   POC Oxazepam (BZO) 08/21/2021 Positive (A)  NONE DETECTED (Cut Off Level 300 ng/mL) Final   POC Cocaine UR 08/21/2021 None Detected  NONE DETECTED (Cut Off Level 300 ng/mL) Final   POC Methamphetamine UR 08/21/2021 None Detected  NONE DETECTED (Cut Off Level  1000 ng/mL) Final   POC Morphine 08/21/2021 None Detected  NONE DETECTED (Cut Off Level 300 ng/mL)  Final   POC Methadone UR 08/21/2021 None Detected  NONE DETECTED (Cut Off Level 300 ng/mL) Final   POC Oxycodone UR 08/21/2021 None Detected  NONE DETECTED (Cut Off Level 100 ng/mL) Final   POC Marijuana UR 08/21/2021 None Detected  NONE DETECTED (Cut Off Level 50 ng/mL) Final   SARSCOV2ONAVIRUS 2 AG 08/21/2021 NEGATIVE  NEGATIVE Final   Comment: (NOTE) SARS-CoV-2 antigen NOT DETECTED.   Negative results are presumptive.  Negative results do not preclude SARS-CoV-2 infection and should not be used as the sole basis for treatment or other patient management decisions, including infection  control decisions, particularly in the presence of clinical signs and  symptoms consistent with COVID-19, or in those who have been in contact with the virus.  Negative results must be combined with clinical observations, patient history, and epidemiological information. The expected result is Negative.  Fact Sheet for Patients: HandmadeRecipes.com.cy  Fact Sheet for Healthcare Providers: FuneralLife.at  This test is not yet approved or cleared by the [place] FDA and  has been authorized for detection and/or diagnosis of SARS-CoV-2 by FDA under an Emergency Use Authorization (EUA).  This EUA will remain in effect (meaning this test can be used) for the duration of  the COV                          ID-19 declaration under Section 564(b)(1) of the Act, 21 U.S.C. section 360bbb-3(b)(1), unless the authorization is terminated or revoked sooner.     Direct LDL 08/21/2021 206.0 (H)  0 - 99 mg/dL Final   Performed at [place] [facility] [address], [place], [place] [address]    Blood Alcohol level:  Lab Results  Component Value Date   ETH 62 (H) [id number]    Metabolic Disorder Labs: Lab Results  Component Value Date   HGBA1C 4.9 08/21/2021   MPG 93.93 08/21/2021   No results found for: PROLACTIN Lab Results  Component Value Date    CHOL 341 (H) 08/21/2021   TRIG 585 (H) 08/21/2021   HDL 43 08/21/2021   CHOLHDL 7.9 08/21/2021   VLDL UNABLE TO CALCULATE IF TRIGLYCERIDE OVER 400 mg/dL 08/21/2021   LDLCALC UNABLE TO CALCULATE IF TRIGLYCERIDE OVER 400 mg/dL 08/21/2021    Therapeutic Lab Levels: No results found for: LITHIUM No results found for: VALPROATE No components found for:  CBMZ  Physical Findings   PHQ2-9    Flowsheet Row ED from 08/20/2021 in [facility]  PHQ-2 Total Score 4  PHQ-9 Total Score 13      [place] ED from 08/20/2021 in [facility] No Risk        Musculoskeletal  Strength & Muscle Tone: within normal limits Gait & Station: normal Patient leans: N/A  Psychiatric Specialty Exam  Presentation  General Appearance: Appropriate for Environment; Casual  Eye Contact:Good  Speech:Clear and Coherent; Normal Rate  Speech Volume:Normal  Handedness:Ambidextrous   Mood and Affect  Mood:-- (I am ok, a little shakey")  Affect:Appropriate; Congruent (anxious)   Thought Process  Thought Processes:Coherent; Goal Directed; Linear  Descriptions of Associations:Intact  Orientation:Full (Time, Place and Person)  Thought Content:WDL; Logical     Hallucinations:Hallucinations: None  Ideas of Reference:None  Suicidal Thoughts:Suicidal Thoughts: No  Homicidal Thoughts:Homicidal Thoughts: No   Sensorium  Memory:Immediate Good;  Recent Good; Remote Good  Judgment:Good  Insight:Good   Executive Functions  Concentration:Good  Attention Span:Good  [place] of Knowledge:Good  Language:Good   Psychomotor Activity  Psychomotor Activity:Psychomotor Activity: Normal   Assets  Assets:Communication Skills; Desire for Improvement; Physical Health; Resilience; Social Support; Talents/Skills; Vocational/Educational; Housing; Financial Resources/Insurance   Sleep  Sleep:Sleep:  Fair Number of Hours of Sleep: 5   Nutritional Assessment (For OBS and FBC admissions only) Has the patient had a weight loss or gain of 10 pounds or more in the last 3 months?: No Has the patient had a decrease in food intake/or appetite?: No Does the patient have dental problems?: No Does the patient have eating habits or behaviors that may be indicators of an eating disorder including binging or inducing vomiting?: No Has the patient recently lost weight without trying?: 0    Physical Exam  Physical Exam Constitutional:      Appearance: Normal appearance. He is normal weight.  HENT:     Head: Normocephalic and atraumatic.  Eyes:     Extraocular Movements: Extraocular movements intact.  Cardiovascular:     Rate and Rhythm: Normal rate and regular rhythm.     Heart sounds: Normal heart sounds.  Pulmonary:     Effort: Pulmonary effort is normal.     Breath sounds: Normal breath sounds.  Abdominal:     General: Abdomen is flat. Bowel sounds are normal.     Palpations: Abdomen is soft.  Neurological:     General: No focal deficit present.     Mental Status: He is alert and oriented to person, place, and time.     Comments: Mild tremor in hands with outstretched BUE  Psychiatric:        Attention and Perception: Attention and perception normal.        Speech: Speech normal.        Behavior: Behavior normal. Behavior is cooperative.        Thought Content: Thought content normal.   Review of Systems  Constitutional:  Positive for diaphoresis. Negative for chills and fever.  HENT:  Negative for hearing loss.   Eyes:  Negative for discharge and redness.  Respiratory:  Negative for cough.   Cardiovascular:  Negative for chest pain.  Gastrointestinal:  Negative for abdominal pain.  Musculoskeletal:  Negative for myalgias.  Neurological:  Positive for tremors. Negative for headaches.  Psychiatric/Behavioral:  Positive for depression and substance abuse. Negative for  hallucinations and suicidal ideas. The patient is nervous/anxious.   Blood pressure 120/74, pulse 83, temperature 98.4 F (36.9 C), temperature source Oral, resp. rate 18, SpO2 99 %. There is no height or weight on file to calculate BMI.  Treatment Plan Summary:   26 year old male with history of alcohol use, anxiety and depression who presented to the [facility] accompanied by his mother seeking alcohol detox on 08/21/2021.  Patient reported that he been drinking 5 -7 40 ounce beers a day since he was 26 years old and increased use on the weekend.  Patient was admitted to the [facility] for alcohol detox and crisis stabilization and started on Ativan taper and CIWA protocol.  EtOH 62; UDS + benzodiazepines.  Patient tolerating Ativan taper well.  Most recent CIWA 0. He reports sx of diaphoresis and tremors, mild tremor noted in outstretched BUE at time of interview.  Patient denies SI/HI/AVH.  Patient reports a history of anxiety and depression and requests increase in Zoloft to better treat symptoms-patient is agreeable to increasing Zoloft  to 50 mg daily.  Will start nicotine patch per  request.  Patient remains appropriate for continued treatment on the [facility] for crisis stabilization and alcohol detox.  Anticipate discharge on Saturday, 08/24/2021 after completion of detox.  AUD, severe -continue CIWA protocol -continue ativan detox- 1 mg QID --> 1 mg TID--> 1 mg BID--> 1 mg daily -multivitamin -thiamine  MDD Anxiety -increase home zoloft from 25 mg to 50 mg  Nicotine dependence -nicoderm cq 21 mg  Dispo: Ongoing. LCSW assisting. Anticipate dc on Saturday 08/24/21 after completion of taper. Patient to be provided with outpatient resources per request on dc for substance use and therapy  Ival Bible, MD 08/21/2021 11:39 AM

## 2021-08-21 NOTE — ED Notes (Signed)
Pt on phone talking with his. Pt calm and cooperative. No c/o pain or distress. Will continue to monitor for safety

## 2021-08-21 NOTE — ED Notes (Signed)
Lab called specimens received at 02:55 from courier and are now being processed.

## 2021-08-21 NOTE — ED Notes (Signed)
Pt attend group and lunch was given 

## 2021-08-21 NOTE — ED Notes (Addendum)
Patient denies SI, HI and AVH at this time. Patient reports last BM on last night. Patient denies any withdrawal symptoms and none noted.Patient denies any complaints or concerns at this time. Patient is able to contract for safety while on the unit. Respirations equal and unlabored, skin warm and dry. No acute distress noted. Will continue to monitor patient.

## 2021-08-21 NOTE — Progress Notes (Signed)
   08/21/21 0010  [facility] Triage Screening (Walk-ins at [facility] only)  How Did You Hear About [place]? Family/Friend  What Is the Reason for Your Visit/Call Today? Ryan Mckenzie is a 26 year old male presenting as a voluntary walk-in to [facility] requesting alcohol detox. Patient denied SI, HI and psychosis. Patient started drinking at the age of 73. Patient reported drinking approx 7x 40 ounce large beers daily and spends approx $300 weekly on alcohol. Patient reported last drink was today, 2x large 40 ounce beers. Patient reported stressors include, money, paying bills "getting paid and then being broke by the end of the week". 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 Patient reported worsening depressive symptoms. Patient denied prior psych hospitalizations, suicide attempts and self-harming behaviors. Patient denied receiving any outpatient mental health services. Patient is currently prescribed Zoloft by his PCP. Patient reported psych medications are not working that well. Patient resides with mother and 1 year old brother. Patient reported no family conflict. Patient was calm and cooperative during assessment. Patient is urgent.  How Long Has This Been Causing You Problems? > than 6 months  Have You Recently Had Any Thoughts About Hurting Yourself? No  Are You Planning to Commit Suicide/Harm Yourself At This time? No  Have you Recently Had Thoughts About Hurting Someone Ryan Mckenzie? No  Are You Planning To Harm Someone At This Time? No  Have You Used Any Alcohol or Drugs in the Past 24 Hours? Yes  How long ago did you use Drugs or Alcohol? hours ago  What Did You Use and How Much? 2 large beers  Do you have any current medical co-morbidities that require immediate attention? No  Clinician description of patient physical appearance/behavior: normal / cooperative  What Do You Feel Would Help You the Most Today? Alcohol or Drug Use  Treatment  If access to [facility] was not available, would you have sought care in the Emergency Department? Yes  Determination of Need Urgent (48 hours)  Options For Referral Facility-Based Crisis;Outpatient Therapy;Medication Management

## 2021-08-22 DIAGNOSIS — F419 Anxiety disorder, unspecified: Secondary | ICD-10-CM | POA: Diagnosis not present

## 2021-08-22 DIAGNOSIS — F10230 Alcohol dependence with withdrawal, uncomplicated: Secondary | ICD-10-CM | POA: Diagnosis not present

## 2021-08-22 DIAGNOSIS — F331 Major depressive disorder, recurrent, moderate: Secondary | ICD-10-CM | POA: Diagnosis not present

## 2021-08-22 DIAGNOSIS — Z20822 Contact with and (suspected) exposure to covid-19: Secondary | ICD-10-CM | POA: Diagnosis not present

## 2021-08-22 LAB — T3, FREE: T3, Free: 4.3 pg/mL (ref 2.0–4.4)

## 2021-08-22 MED ORDER — NICOTINE POLACRILEX 2 MG MT GUM
2.0000 mg | CHEWING_GUM | OROMUCOSAL | Status: DC | PRN
  Administered 2021-08-22: 2 mg via ORAL
  Filled 2021-08-22: qty 1

## 2021-08-22 NOTE — ED Notes (Signed)
Pt was given lunch

## 2021-08-22 NOTE — ED Notes (Signed)
Pt is currently sleeping, no distress noted, environmental check complete, will continue to monitor patient for safety. ? ?

## 2021-08-22 NOTE — ED Notes (Signed)
Received patient this PM. Patient is interacting with peers while watching a movie in the day room. Patient states that he is looking forward to discharge tomorrow... he is home sick. Patient compliant with medication administration. Patient made a brief phone call before retiring for the night. No acute distress noted.  Patient respirations are even and unlabored. Will continue to monitor for safety.

## 2021-08-22 NOTE — Clinical Social Work Psych Note (Signed)
LCSW Update  Ryan Mckenzie shared that he felt good today. He reports sleeping "okay" last night. Ryan Mckenzie denied having any withdrawal symptoms or physical complaints at this time. Ryan Mckenzie denied having any SI, HI or AVH.  Ryan Mckenzie shared that he participated in an AA meeting last night and that the session resonated with him. Ryan Mckenzie states that he plans to continue to participate in meetings once he completes his detox and return home.   Ryan Mckenzie also mentioned that he plans to disassociate himself with his previous friend group due to the negative influence to drink Ryan Mckenzie reports that he has another group of friends who also are participating in recovery and that he plans to engage with that group more often.   Ryan Mckenzie is tentatively scheduled for discharge on Saturday morning (08/24/21). Ryan Mckenzie continues to plan to return home with his family once he completes his detox/taper protocol.   LCSW will provide a work letter, stating the patient can return to work on Monday - per his request.   Ryan Mckenzie denied having any additional questions or concerns at this time.    LCSW will continue to follow.        Ryan Mckenzie, MSW, LCSW Clinical Child psychotherapist (Facility Based Crisis) [facility]

## 2021-08-22 NOTE — Progress Notes (Signed)
Ryan Mckenzie remained OOB in the milieu at intervals throughout the day, restless at intervals. He remained compliant with his medication. He made several calls. Later he verbalized his desire to be discharged related to feeling home sick and wanting to go outside. He was offered to go out to the court yard. His request was met. Later in the afternoon and after dinner he returned to his room. No noted withdrawal systems verbalized or assessed.

## 2021-08-22 NOTE — ED Provider Notes (Signed)
Behavioral Health Progress Note  Date and Time: 08/22/2021 12:19 PM Name: Ryan Mckenzie MRN:  [id number]  Subjective:      26 year old male with history of alcohol use, anxiety and depression who presented to the [facility] accompanied by his mother seeking alcohol detox on 08/21/2021.  Patient reported that he been drinking 5 to 7 40 ounce beers a day since he was 26 years old.  Patient was admitted to the [facility] for alcohol detox and crisis stabilization and started on Ativan taper and CIWA protocol.  EtOH 62; UDS + benzodiazepines.  Patient seen and chart reviewed-most recent CIWA 0, patient has been appropriate with peers with staff and medication compliant.  Interviewed in conjunction with LCSW this morning.  Patient describes his mood as "pretty good".  He denies SI/HI/AVH.  He denies alcohol withdrawal symptoms of nausea, vomiting, GI upset, tremors.  He states that he experienced diaphoresis and hot flashes last night which made sleep difficult; he describes sleep as "on and off".  He denies issues with appetite.  Patient states that he attended AA yesterday and that he feels that it went "good".  Patient expresses that he intends to continue to go to AA meetings upon discharge.  Patient reports that he continues to have nicotine cravings despite having the patch and requests nicotine gum.  Discussed results of TSH, t3 and t4-recommended to follow up with PCP on the outpatient basis for continued monitoring.Discussed with patient that his taper is scheduled to end Saturday 08/24/2021 and recommended he remain at the [facility] until detox is completed-patient verbalizes understanding and is in agreement. Patient was given the opportunity to ask questions and  All questions answered. Patient verbalized understanding regarding plan of care.     Diagnosis:  Final diagnoses:  Alcohol abuse  Anxiety and depression    Total Time spent with patient: 20 minutes  Past Psychiatric History: anxiety, depression, alcohol  use Past Medical History: History reviewed. No pertinent past medical history. History reviewed. No pertinent surgical history. Family History:  Family History  Problem Relation Age of Onset   COPD Mother    Healthy Brother    Family Psychiatric  History:  Mother with anxiety Denies substance use history Maternal great grandfather completed suicide Social History:  Social History   Substance and Sexual Activity  Alcohol Use No     Social History   Substance and Sexual Activity  Drug Use No    Social History   Socioeconomic History   Marital status: Single    Spouse name: Not on file   Number of children: Not on file   Years of education: Not on file   Highest education level: Not on file  Occupational History   Not on file  Tobacco Use   Smoking status: Passive Smoke Exposure - Never Smoker   Smokeless tobacco: Never  Vaping Use   Vaping Use: Every day  Substance and Sexual Activity   Alcohol use: No   Drug use: No   Sexual activity: Never    Birth control/protection: Abstinence  Other Topics Concern   Not on file  Social History Narrative   Not on file   Social Determinants of Health   Financial Resource Strain: Not on file  Food Insecurity: Not on file  Transportation Needs: Not on file  Physical Activity: Not on file  Stress: Not on file  Social Connections: Not on file   SDOH:  SDOH Screenings   Alcohol Screen: Not on file  Depression (PHQ2-9):  Medium Risk   PHQ-2 Score: 13  Financial Resource Strain: Not on file  Food Insecurity: Not on file  Housing: Not on file  Physical Activity: Not on file  Social Connections: Not on file  Stress: Not on file  Tobacco Use: Medium Risk   Smoking Tobacco Use: Passive Smoke Exposure - Never Smoker   Smokeless Tobacco Use: Never   Passive Exposure: Yes  Transportation Needs: Not on file   Additional Social History:    Pain Medications: see MAR Prescriptions: see MAR Over the Counter: see  MAR History of alcohol / drug use?: Yes Name of Substance 1: alcohol 1 - Age of First Use: 22 1 - Amount (size/oz): 7x 40 ounces 1 - Frequency: daily 1 - Last Use / Amount: today                  Sleep: Fair  Appetite:   overall decreased, but ok since admitted to [facility]  Current Medications:  Current Facility-Administered Medications  Medication Dose Route Frequency Provider Last Rate Last Admin   acetaminophen (TYLENOL) tablet 650 mg  650 mg Oral Q6H PRN Sindy Guadeloupe, NP   650 mg at 08/22/21 0536   alum & mag hydroxide-simeth (MAALOX/MYLANTA) 200-200-20 MG/5ML suspension 30 mL  30 mL Oral Q4H PRN Sindy Guadeloupe, NP       hydrOXYzine (ATARAX) tablet 25 mg  25 mg Oral Q6H PRN Sindy Guadeloupe, NP       loperamide (IMODIUM) capsule 2-4 mg  2-4 mg Oral PRN Sindy Guadeloupe, NP       LORazepam (ATIVAN) tablet 1 mg  1 mg Oral Q6H PRN Sindy Guadeloupe, NP       LORazepam (ATIVAN) tablet 1 mg  1 mg Oral TID Sindy Guadeloupe, NP   1 mg at 08/22/21 1025   Followed by   Melene Muller ON 08/23/2021] LORazepam (ATIVAN) tablet 1 mg  1 mg Oral BID Sindy Guadeloupe, NP       Followed by   Melene Muller ON 08/24/2021] LORazepam (ATIVAN) tablet 1 mg  1 mg Oral Daily Sindy Guadeloupe, NP       magnesium hydroxide (MILK OF MAGNESIA) suspension 30 mL  30 mL Oral Daily PRN Sindy Guadeloupe, NP       multivitamin with minerals tablet 1 tablet  1 tablet Oral Daily Sindy Guadeloupe, NP   1 tablet at 08/22/21 1026   nicotine (NICODERM CQ - dosed in mg/24 hours) patch 21 mg  21 mg Transdermal Daily Estella Husk, MD   21 mg at 08/22/21 1024   nicotine polacrilex (NICORETTE) gum 2 mg  2 mg Oral PRN Estella Husk, MD   2 mg at 08/22/21 1027   ondansetron (ZOFRAN-ODT) disintegrating tablet 4 mg  4 mg Oral Q6H PRN Sindy Guadeloupe, NP       sertraline (ZOLOFT) tablet 50 mg  50 mg Oral Daily Estella Husk, MD   50 mg at 08/22/21 1026   thiamine tablet 100 mg  100 mg Oral Daily Sindy Guadeloupe, NP   100 mg at 08/22/21 1026   Current  Outpatient Medications  Medication Sig Dispense Refill   ALPRAZolam (XANAX) 0.25 MG tablet Take 0.25 mg by mouth daily as needed for anxiety (panic attacks).     sertraline (ZOLOFT) 25 MG tablet Take 25 mg by mouth daily.      Labs  Lab Results:  Admission on 08/20/2021  Component Date Value Ref Range Status   SARS Coronavirus 2 by RT PCR 08/21/2021  NEGATIVE  NEGATIVE Final   Comment: (NOTE) SARS-CoV-2 target nucleic acids are NOT DETECTED.  The SARS-CoV-2 RNA is generally detectable in upper respiratory specimens during the acute phase of infection. The lowest concentration of SARS-CoV-2 viral copies this assay can detect is 138 copies/mL. A negative result does not preclude SARS-Cov-2 infection and should not be used as the sole basis for treatment or other patient management decisions. A negative result may occur with  improper specimen collection/handling, submission of specimen other than nasopharyngeal swab, presence of viral mutation(s) within the areas targeted by this assay, and inadequate number of viral copies(<138 copies/mL). A negative result must be combined with clinical observations, patient history, and epidemiological information. The expected result is Negative.  Fact Sheet for Patients:  BloggerCourse.com  Fact Sheet for Healthcare Providers:  SeriousBroker.it  This test is no                          t yet approved or cleared by the [place] FDA and  has been authorized for detection and/or diagnosis of SARS-CoV-2 by FDA under an Emergency Use Authorization (EUA). This EUA will remain  in effect (meaning this test can be used) for the duration of the COVID-19 declaration under Section 564(b)(1) of the Act, 21 U.S.C.section 360bbb-3(b)(1), unless the authorization is terminated  or revoked sooner.       Influenza A by PCR 08/21/2021 NEGATIVE  NEGATIVE Final   Influenza B by PCR 08/21/2021 NEGATIVE   NEGATIVE Final   Comment: (NOTE) The Xpert Xpress SARS-CoV-2/FLU/RSV plus assay is intended as an aid in the diagnosis of influenza from Nasopharyngeal swab specimens and should not be used as a sole basis for treatment. Nasal washings and aspirates are unacceptable for Xpert Xpress SARS-CoV-2/FLU/RSV testing.  Fact Sheet for Patients: BloggerCourse.com  Fact Sheet for Healthcare Providers: SeriousBroker.it  This test is not yet approved or cleared by the [place] FDA and has been authorized for detection and/or diagnosis of SARS-CoV-2 by FDA under an Emergency Use Authorization (EUA). This EUA will remain in effect (meaning this test can be used) for the duration of the COVID-19 declaration under Section 564(b)(1) of the Act, 21 U.S.C. section 360bbb-3(b)(1), unless the authorization is terminated or revoked.  Performed at [facility], [address], [place], [place] [address]    WBC 08/21/2021 6.9  4.0 - 10.5 K/uL Final   RBC 08/21/2021 4.73  4.22 - 5.81 MIL/uL Final   Hemoglobin 08/21/2021 15.3  13.0 - 17.0 g/dL Final   HCT 19/14/7829 44.0  39.0 - 52.0 % Final   MCV 08/21/2021 93.0  80.0 - 100.0 fL Final   MCH 08/21/2021 32.3  26.0 - 34.0 pg Final   MCHC 08/21/2021 34.8  30.0 - 36.0 g/dL Final   RDW 56/21/3086 12.0  11.5 - 15.5 % Final   Platelets 08/21/2021 247  150 - 400 K/uL Final   nRBC 08/21/2021 0.0  0.0 - 0.2 % Final   Neutrophils Relative % 08/21/2021 57  % Final   Neutro Abs 08/21/2021 4.0  1.7 - 7.7 K/uL Final   Lymphocytes Relative 08/21/2021 33  % Final   Lymphs Abs 08/21/2021 2.3  0.7 - 4.0 K/uL Final   Monocytes Relative 08/21/2021 7  % Final   Monocytes Absolute 08/21/2021 0.5  0.1 - 1.0 K/uL Final   Eosinophils Relative 08/21/2021 2  % Final   Eosinophils Absolute 08/21/2021 0.1  0.0 - 0.5 K/uL Final  Basophils Relative 08/21/2021 1  % Final   Basophils Absolute 08/21/2021 0.0  0.0 - 0.1 K/uL  Final   Immature Granulocytes 08/21/2021 0  % Final   Abs Immature Granulocytes 08/21/2021 0.03  0.00 - 0.07 K/uL Final   Performed at [facility], [address] [place], [place] [address]   Sodium 08/21/2021 137  135 - 145 mmol/L Final   Potassium 08/21/2021 3.4 (L)  3.5 - 5.1 mmol/L Final   Chloride 08/21/2021 99  98 - 111 mmol/L Final   CO2 08/21/2021 28  22 - 32 mmol/L Final   Glucose, Bld 08/21/2021 80  70 - 99 mg/dL Final   Glucose reference range applies only to samples taken after fasting for at least 8 hours.   BUN 08/21/2021 7  6 - 20 mg/dL Final   Creatinine, Ser 08/21/2021 0.83  0.61 - 1.24 mg/dL Final   Calcium 11/91/4782 10.3  8.9 - 10.3 mg/dL Final   Total Protein 95/62/1308 7.8  6.5 - 8.1 g/dL Final   Albumin 65/78/4696 4.9  3.5 - 5.0 g/dL Final   AST 29/52/8413 92 (H)  15 - 41 U/L Final   ALT 08/21/2021 111 (H)  0 - 44 U/L Final   Alkaline Phosphatase 08/21/2021 65  38 - 126 U/L Final   Total Bilirubin 08/21/2021 0.6  0.3 - 1.2 mg/dL Final   GFR, Estimated 08/21/2021 >60  >60 mL/min Final   Comment: (NOTE) Calculated using the CKD-EPI Creatinine Equation (2021)    Anion gap 08/21/2021 10  5 - 15 Final   Performed at [facility], [address], [place], [place] [address]   Hgb A1c MFr Bld 08/21/2021 4.9  4.8 - 5.6 % Final   Comment: (NOTE) Pre diabetes:          5.7%-6.4%  Diabetes:              >6.4%  Glycemic control for   <7.0% adults with diabetes    Mean Plasma Glucose 08/21/2021 93.93  mg/dL Final   Performed at [facility], [address], [place], [place] [address]   Magnesium 08/21/2021 2.2  1.7 - 2.4 mg/dL Final   Performed at [facility], [address], [place], [place] [address]   Alcohol, Ethyl (B) 08/21/2021 62 (H)  <10 mg/dL Final   Comment: (NOTE) Lowest detectable limit for serum alcohol is 10 mg/dL.  For medical purposes only. Performed at [facility], [address], [place], [place] [address]     Cholesterol 08/21/2021 341 (H)  0 - 200 mg/dL Final   Triglycerides 25/95/6387 585 (H)  <150 mg/dL Final   HDL 56/43/3295 43  >40 mg/dL Final   Total CHOL/HDL Ratio 08/21/2021 7.9  RATIO Final   VLDL 08/21/2021 UNABLE TO CALCULATE IF TRIGLYCERIDE OVER 400 mg/dL  0 - 40 mg/dL Final   LDL Cholesterol 08/21/2021 UNABLE TO CALCULATE IF TRIGLYCERIDE OVER 400 mg/dL  0 - 99 mg/dL Final   Comment:        Total Cholesterol/HDL:CHD Risk Coronary Heart Disease Risk Table                     Men   Women  1/2 Average Risk   3.4   3.3  Average Risk       5.0   4.4  2 X Average Risk   9.6   7.1  3 X Average Risk  23.4  11.0        Use the calculated Patient Ratio above and the CHD Risk Table to determine the patient's CHD Risk.        ATP III CLASSIFICATION (LDL):  <100     mg/dL   Optimal  [phone]  mg/dL   Near or Above                    Optimal  130-159  mg/dL   Borderline  [phone]  mg/dL   High  >376>190     mg/dL   Very High Performed at [facility], [address] [place], [place] [address]    TSH 08/21/2021 10.094 (H)  0.350 - 4.500 uIU/mL Final   Comment: Performed by a 3rd Generation assay with a functional sensitivity of <=0.01 uIU/mL. Performed at [facility], [address] [place], [place] [address]    POC Amphetamine UR 08/21/2021 None Detected  NONE DETECTED (Cut Off Level 1000 ng/mL) Final   POC Secobarbital (BAR) 08/21/2021 None Detected  NONE DETECTED (Cut Off Level 300 ng/mL) Final   POC Buprenorphine (BUP) 08/21/2021 None Detected  NONE DETECTED (Cut Off Level 10 ng/mL) Final   POC Oxazepam (BZO) 08/21/2021 Positive (A)  NONE DETECTED (Cut Off Level 300 ng/mL) Final   POC Cocaine UR 08/21/2021 None Detected  NONE DETECTED (Cut Off Level 300 ng/mL) Final   POC Methamphetamine UR 08/21/2021 None Detected  NONE DETECTED (Cut Off Level 1000 ng/mL) Final   POC Morphine 08/21/2021 None Detected  NONE DETECTED (Cut Off Level 300 ng/mL) Final   POC Methadone UR  08/21/2021 None Detected  NONE DETECTED (Cut Off Level 300 ng/mL) Final   POC Oxycodone UR 08/21/2021 None Detected  NONE DETECTED (Cut Off Level 100 ng/mL) Final   POC Marijuana UR 08/21/2021 None Detected  NONE DETECTED (Cut Off Level 50 ng/mL) Final   SARSCOV2ONAVIRUS 2 AG 08/21/2021 NEGATIVE  NEGATIVE Final   Comment: (NOTE) SARS-CoV-2 antigen NOT DETECTED.   Negative results are presumptive.  Negative results do not preclude SARS-CoV-2 infection and should not be used as the sole basis for treatment or other patient management decisions, including infection  control decisions, particularly in the presence of clinical signs and  symptoms consistent with COVID-19, or in those who have been in contact with the virus.  Negative results must be combined with clinical observations, patient history, and epidemiological information. The expected result is Negative.  Fact Sheet for Patients: https://www.jennings-kim.com/https://www.fda.gov/media/141569/download  Fact Sheet for Healthcare Providers: https://alexander-rogers.biz/https://www.fda.gov/media/141568/download  This test is not yet approved or cleared by the [place] FDA and  has been authorized for detection and/or diagnosis of SARS-CoV-2 by FDA under an Emergency Use Authorization (EUA).  This EUA will remain in effect (meaning this test can be used) for the duration of  the COV                          ID-19 declaration under Section 564(b)(1) of the Act, 21 U.S.C. section 360bbb-3(b)(1), unless the authorization is terminated or revoked sooner.     Direct LDL 08/21/2021 206.0 (H)  0 - 99 mg/dL Final   Performed at [facility], [address] [place], [place] [address]   T3, Free 08/21/2021 4.3  2.0 - 4.4 pg/mL Final   Comment: (NOTE) Performed At: [facility] [address] [place], [place] [id number] Jolene SchimkeNagendra Sanjai MD OE:[id number]:[phone]    Free T4 08/21/2021  0.84  0.61 - 1.12 ng/dL Final   Comment: (NOTE) Biotin ingestion may interfere with free T4 tests.  If the results are inconsistent with the TSH level, previous test results, or the clinical presentation, then consider biotin interference. If needed, order repeat testing after stopping biotin. Performed at [facility], [address], [place], [place] [address]     Blood Alcohol level:  Lab Results  Component Value Date   ETH 62 (H) 08/21/2021    Metabolic Disorder Labs: Lab Results  Component Value Date   HGBA1C 4.9 08/21/2021   MPG 93.93 08/21/2021   No results found for: PROLACTIN Lab Results  Component Value Date   CHOL 341 (H) 08/21/2021   TRIG 585 (H) 08/21/2021   HDL 43 08/21/2021   CHOLHDL 7.9 08/21/2021   VLDL UNABLE TO CALCULATE IF TRIGLYCERIDE OVER 400 mg/dL 62/13/0865   LDLCALC UNABLE TO CALCULATE IF TRIGLYCERIDE OVER 400 mg/dL 78/46/9629    Therapeutic Lab Levels: No results found for: LITHIUM No results found for: VALPROATE No components found for:  CBMZ  Physical Findings   PHQ2-9    Flowsheet Row ED from 08/20/2021 in [facility]  PHQ-2 Total Score 4  PHQ-9 Total Score 13      Flowsheet Row ED from 08/20/2021 in [facility]  C-SSRS RISK CATEGORY No Risk        Musculoskeletal  Strength & Muscle Tone: within normal limits Gait & Station: normal Patient leans: N/A  Psychiatric Specialty Exam  Presentation  General Appearance: Appropriate for Environment; Casual  Eye Contact:Good  Speech:Clear and Coherent; Normal Rate  Speech Volume:Normal  Handedness:Ambidextrous   Mood and Affect  Mood:-- ("pretty good")  Affect:Appropriate; Congruent   Thought Process  Thought Processes:Coherent; Goal Directed; Linear  Descriptions of Associations:Intact  Orientation:Full (Time, Place and Person)  Thought Content:WDL; Logical     Hallucinations:Hallucinations: None  Ideas of Reference:None  Suicidal Thoughts:Suicidal Thoughts: No  Homicidal Thoughts:Homicidal  Thoughts: No   Sensorium  Memory:Immediate Good; Recent Good; Remote Good  Judgment:Good  Insight:Good   Executive Functions  Concentration:Good  Attention Span:Good  Recall:Good  Fund of Knowledge:Good  Language:Good   Psychomotor Activity  Psychomotor Activity:Psychomotor Activity: Normal   Assets  Assets:Communication Skills; Desire for Improvement; Financial Resources/Insurance; Housing; Physical Health; Social Support; Resilience; Transportation; Vocational/Educational; Talents/Skills   Sleep  Sleep:Sleep: Fair Number of Hours of Sleep: 5   Nutritional Assessment (For OBS and FBC admissions only) Has the patient had a weight loss or gain of 10 pounds or more in the last 3 months?: No Has the patient had a decrease in food intake/or appetite?: No Does the patient have dental problems?: No Does the patient have eating habits or behaviors that may be indicators of an eating disorder including binging or inducing vomiting?: No Has the patient recently lost weight without trying?: 0    Physical Exam  Physical Exam Constitutional:      Appearance: Normal appearance. He is normal weight.  HENT:     Head: Normocephalic and atraumatic.  Eyes:     Extraocular Movements: Extraocular movements intact.  Pulmonary:     Effort: Pulmonary effort is normal.  Neurological:     General: No focal deficit present.     Mental Status: He is alert and oriented to person, place, and time.     Comments: No tremor present in bilateral outstretched hands  Psychiatric:        Attention and Perception: Attention and  perception normal.        Speech: Speech normal.        Behavior: Behavior normal. Behavior is cooperative.        Thought Content: Thought content normal.   Review of Systems  Constitutional:  Positive for diaphoresis. Negative for chills and fever.  HENT:  Negative for hearing loss.   Eyes:  Negative for discharge and redness.  Respiratory:  Negative for  cough.   Cardiovascular:  Negative for chest pain.  Gastrointestinal:  Negative for abdominal pain.  Musculoskeletal:  Negative for myalgias.  Neurological:  Negative for tremors and headaches.  Psychiatric/Behavioral:  Positive for substance abuse. Negative for depression, hallucinations and suicidal ideas. The patient is not nervous/anxious.   Blood pressure 133/86, pulse 77, temperature 98 F (36.7 C), temperature source Temporal, resp. rate 16, SpO2 98 %. There is no height or weight on file to calculate BMI.  Treatment Plan Summary:   26 year old male with history of alcohol use, anxiety and depression who presented to the [facility] accompanied by his mother seeking alcohol detox on 08/21/2021.  Patient reported that he been drinking 5 -7 40 ounce beers a day since he was 26 years old and increased use on the weekend.  Patient was admitted to the [facility] for alcohol detox and crisis stabilization and started on Ativan taper and CIWA protocol.  EtOH 62; UDS + benzodiazepines.  Patient tolerating Ativan taper well.  Most recent CIWA 0. He reports sx of diaphoresis and hot flashes yesterday which interfered with sleep.   Patient denies SI/HI/AVH.  Patient has tolerated increase in zoloft well without se/ae.  Discussed thyroid labs amd advised patient to follow up with PCP for continued monitoring. Patient remains appropriate for continued treatment on the [facility] for crisis stabilization and alcohol detox.  Anticipate discharge on Saturday, 08/24/2021 after completion of detox.  AUD, severe -continue CIWA protocol -continue ativan detox- 1 mg QID --> 1 mg TID--> 1 mg BID--> 1 mg daily -multivitamin -thiamine  MDD Anxiety -increased home zoloft from 25 mg to 50 mg (5/31)  Nicotine dependence -nicoderm cq 21 mg -prn nicotine gum  High TSH-Subclinical hypothyroidism -TSH 10.094 -t3 and t4 wnl -advised to follow up with PCP for monitoring  Dispo: Ongoing. LCSW assisting. Anticipate dc on Saturday  08/24/21 after completion of taper. Patient to be provided with outpatient resources per request on dc for substance use and therapy  Estella Husk, MD 08/22/2021 12:19 PM

## 2021-08-22 NOTE — Group Note (Unsigned)
Group Topic: Recovery Basics  Group Date: 08/22/2021 Start Time: 1100 End Time: 1200 Facilitators: Candis Schatz, NT  Department: [facility]  Number of Participants: 1  Group Focus: acceptance Treatment Modality:  Eclectic Therapy Interventions utilized were clarification Purpose: enhance coping skills   Name: Ryan Mckenzie Date of Birth: 1995/12/27  MR: [id number]    Level of Participation: {THERAPIES; PSYCH GROUP PARTICIPATION DPOEU:23536} Quality of Participation: {THERAPIES; PSYCH QUALITY OF PARTICIPATION:23992} Interactions with others: {THERAPIES; PSYCH INTERACTIONS:23993} Mood/Affect: {THERAPIES; PSYCH MOOD/AFFECT:23994} Triggers (if applicable): *** Cognition: {THERAPIES; PSYCH COGNITION:23995} Progress: {THERAPIES; PSYCH PROGRESS:23997} Response: *** Plan: {THERAPIES; PSYCH RWER:15400}  Patients Problems:  Patient Active Problem List   Diagnosis Date Noted   Alcohol abuse 08/21/2021

## 2021-08-22 NOTE — Group Note (Signed)
Group Topic: Decisional Balance/Substance Abuse  Group Date: 08/22/2021 Start Time: 1000 End Time: 1015 Facilitators: Parks Neptune, NT  Department: [facility]  Number of Participants: 1  Group Focus: coping skills Treatment Modality:  Exposure Therapy, Individual Therapy, and Skills Training Interventions utilized were clarification, patient education, and problem solving Purpose: enhance coping skills, express feelings, and increase insight  Name: Ryan Mckenzie Date of Birth: 1995/11/11  MR: [id number]    Level of Participation: active Quality of Participation: attentive and cooperative Interactions with others: gave feedback Mood/Affect: appropriate and bright Triggers (if applicable): stress Cognition: coherent/clear and insightful Progress: Moderate Response: positive coping skills. Plan: patient will be encouraged to utilize coping skills.   Patients Problems:  Patient Active Problem List   Diagnosis Date Noted   Alcohol abuse 08/21/2021

## 2021-08-22 NOTE — Progress Notes (Signed)
Received Ryan Mckenzie this AM asleep in his bed, he woke up for breakfast and was medication compliant. He attended the morning group therapy session. He denied all of the psychiatric symptoms this AM. He is pleasant and calm this morning.

## 2021-08-23 DIAGNOSIS — F10230 Alcohol dependence with withdrawal, uncomplicated: Secondary | ICD-10-CM | POA: Diagnosis not present

## 2021-08-23 DIAGNOSIS — F419 Anxiety disorder, unspecified: Secondary | ICD-10-CM | POA: Diagnosis not present

## 2021-08-23 DIAGNOSIS — F331 Major depressive disorder, recurrent, moderate: Secondary | ICD-10-CM | POA: Diagnosis not present

## 2021-08-23 DIAGNOSIS — Z20822 Contact with and (suspected) exposure to covid-19: Secondary | ICD-10-CM | POA: Diagnosis not present

## 2021-08-23 MED ORDER — SERTRALINE HCL 50 MG PO TABS: 50.0000 mg | ORAL_TABLET | Freq: Every day | ORAL | 0 refills | Status: DC

## 2021-08-23 MED ORDER — LORAZEPAM 1 MG PO TABS
1.0000 mg | ORAL_TABLET | Freq: Every day | ORAL | 0 refills | Status: AC
Start: 2021-08-24 — End: 2021-08-26

## 2021-08-23 NOTE — ED Provider Notes (Signed)
FBC/OBS ASAP Discharge Summary  Date and Time: 08/23/2021 11:18 AM  Name: Ryan Mckenzie  MRN:  [id number]   Discharge Diagnoses:  Final diagnoses:  Alcohol abuse  Anxiety and depression  Alcohol withdrawal syndrome without complication (HCC)  Moderate episode of recurrent major depressive disorder ([place])    Subjective:  Patient seen and chart reviewed-patient has been medication compliant and appropriate with staff and peers on the unit.  Most recent CIWA 0.  Patient interviewed in conjunction with LCSW this morning.   On my interview, patient is in NAD, alert, oriented, calm, cooperative, and attentive, with normal affect, speech, and behavior. Objectively, there is no evidence of psychosis/ mania (able to converse coherently, linear and goal directed thought, no RIS, no distractibility, not pre-occupied, no FOI, etc) nor depression to the point of suicidality (able to concentrate, affect full and reactive, speech normal r/v/t, no psychomotor retardation/agitation, etc).   Patient describes his mood as "great".  Patient states "I am ready to go home".  Patient goes on to state that he has not been sleeping well due to being in an unfamiliar environment and would like to sleep in his own bed.  Patient reports that he had diarrhea yesterday; however, he denies diarrhea, nausea, vomiting, tremors, diaphoresis, GI upset today.  He denies SI/HI/AVH.  Patient requested discharge today.  Discussed with patient that he remains on Ativan taper and that it is recommended that he remain here until tomorrow morning in order to complete Ativan taper.  Discussed the risks of leaving [place] including worsening of withdrawal symptoms, seizure, death.   Patient verbalized understanding and requests discharge. Discussed return precautions. Patient signed AMA and was discharged per request.      Stay Summary:   26 year old male with history of alcohol use, anxiety and depression who presented to  the [facility] accompanied by his mother seeking alcohol detox on 08/21/2021.  Patient reported that he been drinking 5 to 7 40 ounce beers a day since he was 26 years old.  Patient was admitted to the [facility] for alcohol detox and crisis stabilization and started on Ativan taper and CIWA protocol.  EtOH 62; UDS + benzodiazepines.  Patient reported doing well on Zoloft 25 mg; although requested an increase in this medication.  Zoloft was increased to 50 mg on 08/21/2021.  Patient tolerated medication increase well without SE/AE. CIWA scores were low throughout admission ranging from 0-2. On 08/23/21 patient requested to leave AMA. Patient was able to acknowledge the risks of leaving AMA and was discharged per request. Return precautions provided. See above for additional details for day of discharge interview.         Total Time spent with patient: 30 minutes  Past Psychiatric History: anxiety, depression, alcohol use Past Medical History: History reviewed. No pertinent past medical history. History reviewed. No pertinent surgical history. Family History:  Family History  Problem Relation Age of Onset   COPD Mother    Healthy Brother    Family Psychiatric History:  Mother with anxiety Denies substance use history Maternal great grandfather completed suicide Social History:  Social History   Substance and Sexual Activity  Alcohol Use No     Social History   Substance and Sexual Activity  Drug Use No    Social History   Socioeconomic History   Marital status: Single    Spouse name: Not on file   Number of children: Not on file   Years of education: Not on file  Highest education level: Not on file  Occupational History   Not on file  Tobacco Use   Smoking status: Passive Smoke Exposure - Never Smoker   Smokeless tobacco: Never  Vaping Use   Vaping Use: Every day  Substance and Sexual Activity   Alcohol use: No   Drug use: No   Sexual activity: Never    Birth control/protection:  Abstinence  Other Topics Concern   Not on file  Social History Narrative   Not on file   Social Determinants of Health   Financial Resource Strain: Not on file  Food Insecurity: Not on file  Transportation Needs: Not on file  Physical Activity: Not on file  Stress: Not on file  Social Connections: Not on file   SDOH:  SDOH Screenings   Alcohol Screen: Not on file  Depression (PHQ2-9): Medium Risk   PHQ-2 Score: 13  Financial Resource Strain: Not on file  Food Insecurity: Not on file  Housing: Not on file  Physical Activity: Not on file  Social Connections: Not on file  Stress: Not on file  Tobacco Use: Medium Risk   Smoking Tobacco Use: Passive Smoke Exposure - Never Smoker   Smokeless Tobacco Use: Never   Passive Exposure: Yes  Transportation Needs: Not on file    Tobacco Cessation:  Prescription not provided because: declined  Current Medications:  Current Facility-Administered Medications  Medication Dose Route Frequency Provider Last Rate Last Admin   acetaminophen (TYLENOL) tablet 650 mg  650 mg Oral Q6H PRN Evette Georges, NP   650 mg at 08/22/21 0536   alum & mag hydroxide-simeth (MAALOX/MYLANTA) 200-200-20 MG/5ML suspension 30 mL  30 mL Oral Q4H PRN Evette Georges, NP       hydrOXYzine (ATARAX) tablet 25 mg  25 mg Oral Q6H PRN Evette Georges, NP       loperamide (IMODIUM) capsule 2-4 mg  2-4 mg Oral PRN Evette Georges, NP       LORazepam (ATIVAN) tablet 1 mg  1 mg Oral Q6H PRN Evette Georges, NP       LORazepam (ATIVAN) tablet 1 mg  1 mg Oral BID Evette Georges, NP   1 mg at 08/23/21 [id number]   Followed by   Derrill Memo ON 08/24/2021] LORazepam (ATIVAN) tablet 1 mg  1 mg Oral Daily Evette Georges, NP       magnesium hydroxide (MILK OF MAGNESIA) suspension 30 mL  30 mL Oral Daily PRN Evette Georges, NP       multivitamin with minerals tablet 1 tablet  1 tablet Oral Daily Evette Georges, NP   1 tablet at 08/23/21 [id number]   nicotine (NICODERM CQ - dosed in mg/24 hours) patch 21 mg  21  mg Transdermal Daily Ival Bible, MD   21 mg at 08/22/21 1024   nicotine polacrilex (NICORETTE) gum 2 mg  2 mg Oral PRN Ival Bible, MD   2 mg at 08/22/21 1027   ondansetron (ZOFRAN-ODT) disintegrating tablet 4 mg  4 mg Oral Q6H PRN Evette Georges, NP       sertraline (ZOLOFT) tablet 50 mg  50 mg Oral Daily Ival Bible, MD   50 mg at 08/23/21 [id number]   thiamine tablet 100 mg  100 mg Oral Daily Evette Georges, NP   100 mg at 08/23/21 [id number]   Current Outpatient Medications  Medication Sig Dispense Refill   [START ON 08/24/2021] LORazepam (ATIVAN) 1 MG tablet Take 1 tablet (1 mg total) by mouth daily for 2 doses.  Remainder of ativan taper. Take 1 mg at 10 pm on 6/2 and 1 mg at 10 am on 6/3 2 tablet 0   [START ON 08/24/2021] sertraline (ZOLOFT) 50 MG tablet Take 1 tablet (50 mg total) by mouth daily. 30 tablet 0    PTA Medications: (Not in a hospital admission)   Musculoskeletal  Strength & Muscle Tone: within normal limits Gait & Station: normal Patient leans: N/A  Psychiatric Specialty Exam  Presentation  General Appearance: Appropriate for Environment; Casual  Eye Contact:Good  Speech:Clear and Coherent; Normal Rate  Speech Volume:Normal  Handedness:Ambidextrous   Mood and Affect  Mood:-- ("great")  Affect:Appropriate; Congruent; Other (comment) (bright)   Thought Process  Thought Processes:Coherent; Goal Directed; Linear  Descriptions of Associations:Intact  Orientation:Full (Time, Place and Person)  Thought Content:Logical; WDL     Hallucinations:Hallucinations: None  Ideas of Reference:None  Suicidal Thoughts:Suicidal Thoughts: No  Homicidal Thoughts:Homicidal Thoughts: No   Sensorium  Memory:Immediate Good; Recent Good; Remote Good  Judgment:Fair  Insight:Fair   Executive Functions  Concentration:Good  Attention Span:Good  [place] of Knowledge:Good  Language:Good   Psychomotor Activity  Psychomotor  Activity:Psychomotor Activity: Normal   Assets  Assets:Communication Skills; Desire for Improvement; Resilience; Social Support; Housing; Vocational/Educational; Physical Health   Sleep  Sleep:Sleep: Poor   No data recorded  Physical Exam  Physical Exam Constitutional:      Appearance: Normal appearance. He is normal weight.  HENT:     Head: Normocephalic and atraumatic.  Eyes:     Extraocular Movements: Extraocular movements intact.  Pulmonary:     Effort: Pulmonary effort is normal.  Neurological:     General: No focal deficit present.     Mental Status: He is alert and oriented to person, place, and time.     Comments: No tremors  Psychiatric:        Attention and Perception: Attention and perception normal.        Speech: Speech normal.        Behavior: Behavior normal. Behavior is cooperative.        Thought Content: Thought content normal.   Review of Systems  Constitutional:  Negative for chills and fever.  HENT:  Negative for hearing loss.   Eyes:  Negative for discharge and redness.  Respiratory:  Negative for cough.   Cardiovascular:  Negative for chest pain.  Gastrointestinal:  Negative for abdominal pain.  Musculoskeletal:  Negative for myalgias.  Neurological:  Negative for headaches.  Psychiatric/Behavioral:  Positive for substance abuse. Negative for depression, hallucinations and suicidal ideas. The patient is not nervous/anxious.   Blood pressure 117/87, pulse 91, temperature 97.8 F (36.6 C), temperature source Tympanic, resp. rate 16, SpO2 100 %. There is no height or weight on file to calculate BMI.  Demographic Factors:  Male and Caucasian  Loss Factors: Financial problems/change in socioeconomic status  Historical Factors: Family history of suicide, Family history of mental illness or substance abuse, and substance use  Risk Reduction Factors:   Sense of responsibility to family, Employed, Living with another person, especially a relative,  and Positive social support  Continued Clinical Symptoms:  Depression:   Comorbid alcohol abuse/dependence Alcohol/Substance Abuse/Dependencies Previous Psychiatric Diagnoses and Treatments Anxiety  Cognitive Features That Contribute To Risk:  Thought constriction (tunnel vision)    Suicide Risk:  Minimal: No identifiable suicidal ideation.  Patients presenting with no risk factors but with morbid ruminations; may be classified as minimal risk based on the severity of the depressive symptoms  Plan Of Care/Follow-up recommendations:  Activity:  as tolerated Diet:  regular Other:    Take all medications as prescribed by his/her mental healthcare provider. Report any adverse effects and or reactions from the medicines to your outpatient provider promptly. Do not engage in alcohol and or illegal drug use while on prescription medicines. In the event of worsening symptoms, call the crisis hotline, 911 and or go to the nearest ED for appropriate evaluation and treatment of symptoms. follow-up with your primary care provider for your other medical issues, concerns and or health care needs.   Allergies as of 08/23/2021   No Known Allergies      Medication List     STOP taking these medications    ALPRAZolam 0.25 MG tablet Commonly known as: XANAX       TAKE these medications    LORazepam 1 MG tablet Commonly known as: ATIVAN Take 1 tablet (1 mg total) by mouth daily for 2 doses. Remainder of ativan taper. Take 1 mg at 10 pm on 6/2 and 1 mg at 10 am on 6/3 Start taking on: August 24, 2021   sertraline 50 MG tablet Commonly known as: ZOLOFT Take 1 tablet (50 mg total) by mouth daily. Start taking on: August 24, 2021 What changed:  medication strength how much to take        Patient was provided with 7 day samples of above medications as well as paper prescriptions for 30 days t time of discharge.  Patient was provided with follow up information regarding psychiatric  outpatient resources in [place] with the assistance of SW prior to discharge.      Disposition: self care  Ival Bible, MD 08/23/2021, 11:18 AM

## 2021-08-23 NOTE — Clinical Social Work Psych Note (Signed)
LCSW Update/Discharge Note    Ryan Mckenzie requested to be discharged today.   Ryan Mckenzie reports that he is wanting to return home today due to being "home sick". He also shared that he has not been able to sleep appropriately here at the [facility] due to the bed being uncomfortable.   Ryan Mckenzie denied having any SI, HI or AVH at this time. Ryan Mckenzie also denied having any withdrawal or physical complaints.   Ryan Mckenzie's Ativan taper was not scheduled to end until Saturday. Dr. Bronwen Betters, MD explained to [facility] that it was NOT medically recommended for him to discharge prior to his Ativan taper ending.   Dr. Bronwen Betters explained the potential medical effects that could result from not completing the Ativan taper protocol. Ryan Mckenzie expressed understanding, however expressed that he would still like to be discahrged today.    Ryan Mckenzie agreed to sign himelf out AMA today.   Ryan Mckenzie reports that he is returning home with his family and that he plans to engage in AA meetings with some friends who are also in recovery.   Ryan Mckenzie shared that his mother is picking him up and that he does not have any additional questions or concerns.   LCSW will continue to follow until discharge.    Baldo Daub, MSW, LCSW Clinical Child psychotherapist (Facility Based Crisis) [facility]

## 2021-08-23 NOTE — ED Notes (Signed)
Pt noted walking in hall when staff entered building.  Pt reports he would like to discharge today.  He states he feels "great" and would like to sleep in his own bed. Pt denies, SI, HI, and AVH.  Reports no pain.  Breathing is even and unlabored.  Will continue to monitor for safety.

## 2021-08-23 NOTE — ED Notes (Signed)
Pt. Came up to Nurses station asking if he could discharge today. RN told him he must talk to the provider when they come in on first shift around 8.

## 2021-08-23 NOTE — Discharge Instructions (Signed)
Take all medications as prescribed by his/her mental healthcare provider. Report any adverse effects and or reactions from the medicines to your outpatient provider promptly. Do not engage in alcohol and or illegal drug use while on prescription medicines. In the event of worsening symptoms, call the crisis hotline, 911 and or go to the nearest ED for appropriate evaluation and treatment of symptoms. follow-up with your primary care provider for your other medical issues, concerns and or health care needs.  Refills will not be given by the physician you saw on this admission as this physician is not your regular outpatient provider. Please follow up with your outpatient provider for refills. If you do  not have a psychiatrist or psychiatry provider, you may ask your primary care provider for assistance with refills  You are being prescribed the remainder of your ativan taper to complete at home. Take 1 mg on 08/23/21 at 10 pm and 1 mg at 10 am on 08/24/2021. Do not drink while on this medication.

## 2021-10-18 ENCOUNTER — Ambulatory Visit (HOSPITAL_COMMUNITY)
Admission: EM | Admit: 2021-10-18 | Discharge: 2021-10-18 | Disposition: A | Discharge status: Other Acute Inpatient Hospital | Payer: Self-pay

## 2021-10-18 DIAGNOSIS — Z538 Procedure and treatment not carried out for other reasons: Secondary | ICD-10-CM

## 2021-10-18 DIAGNOSIS — Z760 Encounter for issue of repeat prescription: Secondary | ICD-10-CM

## 2021-10-18 NOTE — ED Provider Notes (Signed)
Pt will follow-up at [facility] for medication management services. Front desk refunded payment. Walk in med management hours provided at [facility] and provided option of walk in UC services.   Bing Neighbors, FNP 10/18/21 1200

## 2021-11-22 ENCOUNTER — Ambulatory Visit (HOSPITAL_COMMUNITY)
Admission: EM | Admit: 2021-11-22 | Discharge: 2021-11-22 | Disposition: A | Discharge status: Home / Self Care | Payer: Self-pay | Attending: Emergency Medicine | Admitting: Emergency Medicine

## 2021-11-22 ENCOUNTER — Encounter (HOSPITAL_COMMUNITY): Payer: Self-pay | Admitting: *Deleted

## 2021-11-22 DIAGNOSIS — H60392 Other infective otitis externa, left ear: Secondary | ICD-10-CM

## 2021-11-22 MED ORDER — OFLOXACIN 0.3 % OT SOLN: 10.0000 [drp] | Freq: Every day | OTIC | 0 refills | Status: DC

## 2021-11-22 NOTE — ED Triage Notes (Signed)
Pt states that he has left ear pain on and off X 1 week. He has taken IBU without relief.

## 2021-11-22 NOTE — Discharge Instructions (Signed)
On your exam there was green discharge within your ear canal and therefore you are being treated for an outer ear infection  Place 10 drops of ofloxacin into the left ear every morning for 7 days  You may use Tylenol or ibuprofen for management of discomfort  May hold warm compresses to the ear for additional comfort  Please not attempted any ear cleaning or object or fluid placement into the ear canal to prevent further irritation  If you have not seen any improvement in your symptoms after 3 days of medication use please follow-up for reevaluation

## 2021-11-22 NOTE — ED Provider Notes (Signed)
[facility]    CSN: [id number] Arrival date & time: 11/22/21  4098      History   Chief Complaint Chief Complaint  Patient presents with   Otalgia    HPI Ryan Mckenzie is a 26 y.o. male.   Patient presents with intermittent left-sided ear pain for 7 days.  Endorses that at 1 point he felt pressure relief which temporarily resolve symptoms for 1 to 2 minutes before they returned.  Has attempted use of ibuprofen which has been minimally effective.  Denies drainage, pruritus, decreased hearing, fever, chills or congestion.  Recent swimming 2 weeks ago.   History reviewed. No pertinent past medical history.  Patient Active Problem List   Diagnosis Date Noted   Alcohol abuse 08/21/2021    History reviewed. No pertinent surgical history.     Home Medications    Prior to Admission medications   Medication Sig Start Date End Date Taking? Authorizing Provider  sertraline (ZOLOFT) 50 MG tablet Take 1 tablet (50 mg total) by mouth daily. 08/24/21   Estella Husk, MD    Family History Family History  Problem Relation Age of Onset   COPD Mother    Healthy Brother     Social History Social History   Tobacco Use   Smoking status: Never    Passive exposure: Yes   Smokeless tobacco: Never  Vaping Use   Vaping Use: Every day  Substance Use Topics   Alcohol use: No   Drug use: No     Allergies   Patient has no known allergies.   Review of Systems Review of Systems  Constitutional: Negative.   HENT:  Positive for ear pain. Negative for congestion, dental problem, drooling, ear discharge, facial swelling, hearing loss, mouth sores, nosebleeds, postnasal drip, rhinorrhea, sinus pressure, sinus pain, sneezing, sore throat, tinnitus, trouble swallowing and voice change.   Respiratory: Negative.    Cardiovascular: Negative.      Physical Exam Triage Vital Signs ED Triage Vitals  Enc Vitals Group     BP 11/22/21 0843 125/76     Pulse Rate 11/22/21  0843 (!) 59     Resp 11/22/21 0843 18     Temp 11/22/21 0843 97.9 F (36.6 C)     Temp Source 11/22/21 0843 Oral     SpO2 11/22/21 0843 98 %     Weight --      Height --      Head Circumference --      Peak Flow --      Pain Score 11/22/21 0842 8     Pain Loc --      Pain Edu? --      Excl. in GC? --    No data found.  Updated Vital Signs BP 125/76 (BP Location: Right Arm)   Pulse (!) 59   Temp 97.9 F (36.6 C) (Oral)   Resp 18   SpO2 98%   Visual Acuity Right Eye Distance:   Left Eye Distance:   Bilateral Distance:    Right Eye Near:   Left Eye Near:    Bilateral Near:     Physical Exam Constitutional:      Appearance: Normal appearance.  HENT:     Right Ear: Tympanic membrane, ear canal and external ear normal.     Ears:     Comments: Purulent green drainage noted to the left ear canal with mild to moderate swelling and erythema, tragus tenderness present, no abnormalities to the tympanic  membrane Eyes:     Extraocular Movements: Extraocular movements intact.  Pulmonary:     Effort: Pulmonary effort is normal.  Skin:    General: Skin is warm and dry.  Neurological:     Mental Status: He is alert and oriented to person, place, and time. Mental status is at baseline.  Psychiatric:        Mood and Affect: Mood normal.        Behavior: Behavior normal.      UC Treatments / Results  Labs (all labs ordered are listed, but only abnormal results are displayed) Labs Reviewed - No data to display  EKG   Radiology No results found.  Procedures Procedures (including critical care time)  Medications Ordered in UC Medications - No data to display  Initial Impression / Assessment and Plan / UC Course  I have reviewed the triage vital signs and the nursing notes.  Pertinent labs & imaging results that were available during my care of the patient were reviewed by me and considered in my medical decision making (see chart for details).  Infective otitis  externa of left ear  Vital signs are stable patient is in no signs of distress, presentation of the ear canal is consistent with infection, discussed with patient, ofloxacin prescribed and discussed administration, may continue use of over-the-counter analgesics compresses to the external ear, advised against swimming or ear cleaning until symptoms have resolved and medication may complete, may follow-up with urgent care as needed Final Clinical Impressions(s) / UC Diagnoses   Final diagnoses:  None   Discharge Instructions   None    ED Prescriptions   None    PDMP not reviewed this encounter.   Valinda Hoar, [place] 11/22/21 [phone]

## 2021-11-24 ENCOUNTER — Ambulatory Visit (HOSPITAL_COMMUNITY)
Admission: EM | Admit: 2021-11-24 | Discharge: 2021-11-24 | Disposition: A | Discharge status: Home / Self Care | Payer: Self-pay | Attending: Internal Medicine | Admitting: Internal Medicine

## 2021-11-24 ENCOUNTER — Encounter (HOSPITAL_COMMUNITY): Payer: Self-pay

## 2021-11-24 DIAGNOSIS — H60392 Other infective otitis externa, left ear: Secondary | ICD-10-CM

## 2021-11-24 MED ORDER — AMOXICILLIN-POT CLAVULANATE 875-125 MG PO TABS: 1.0000 | ORAL_TABLET | Freq: Two times a day (BID) | ORAL | 0 refills | Status: DC

## 2021-11-24 NOTE — ED Triage Notes (Signed)
Pt is here for follow up ear pain . Pt stated that drops are not working. Pt is continuing with pain in the left ear. [id number]

## 2021-11-24 NOTE — ED Provider Notes (Signed)
[facility]    CSN: [id number] Arrival date & time: 11/24/21  1240      History   Chief Complaint Chief Complaint  Patient presents with   Otalgia    HPI Ryan Mckenzie is a 26 y.o. male.   Patient presents urgent care for evaluation of ongoing and worsening left-sided ear pain after otitis externa diagnosis 2 days ago.  He states he was seen 2 days ago and prescribed ofloxacin eardrops which have not been helping with symptoms at all.  He continues to have severe left-sided ear pain and drainage.  States he went swimming 2 or 3 weeks ago and suspects that the ear infection happened after this.  States that whenever the seasons change, he also gets ear infections and typically has to be placed on oral antibiotics to help it resolve.  Reports normal hearing to bilateral ears.  Denies fever/chills, URI symptoms, neck pain, abdominal pain, and headache.  Has not attempted use of any other over-the-counter medications prior to arrival urgent care for symptoms.   Otalgia   History reviewed. No pertinent past medical history.  Patient Active Problem List   Diagnosis Date Noted   Alcohol abuse 08/21/2021    History reviewed. No pertinent surgical history.     Home Medications    Prior to Admission medications   Medication Sig Start Date End Date Taking? Authorizing Provider  amoxicillin-clavulanate (AUGMENTIN) 875-125 MG tablet Take 1 tablet by mouth every 12 (twelve) hours. 11/24/21  Yes Carlisle Beers, FNP  ofloxacin (FLOXIN) 0.3 % OTIC solution Place 10 drops into the left ear daily. 11/22/21   White, Elita Boone, NP  sertraline (ZOLOFT) 50 MG tablet Take 1 tablet (50 mg total) by mouth daily. 08/24/21   Estella Husk, MD    Family History Family History  Problem Relation Age of Onset   COPD Mother    Healthy Brother     Social History Social History   Tobacco Use   Smoking status: Never    Passive exposure: Yes   Smokeless tobacco: Never   Vaping Use   Vaping Use: Every day  Substance Use Topics   Alcohol use: No   Drug use: No     Allergies   Patient has no known allergies.   Review of Systems Review of Systems  HENT:  Positive for ear pain.   Per HPI   Physical Exam Triage Vital Signs ED Triage Vitals  Enc Vitals Group     BP 11/24/21 1317 128/77     Pulse Rate 11/24/21 1317 67     Resp 11/24/21 1317 12     Temp 11/24/21 1317 98.7 F (37.1 C)     Temp Source 11/24/21 1317 Oral     SpO2 11/24/21 1317 100 %     Weight 11/24/21 1316 193 lb (87.5 kg)     Height 11/24/21 1316 5\' 7"  (1.702 m)     Head Circumference --      Peak Flow --      Pain Score 11/24/21 1315 9     Pain Loc --      Pain Edu? --      Excl. in GC? --    No data found.  Updated Vital Signs BP 128/77 (BP Location: Left Wrist)   Pulse 67   Temp 98.7 F (37.1 C) (Oral)   Resp 12   Ht 5\' 7"  (1.702 m)   Wt 193 lb (87.5 kg)   SpO2 100%  BMI 30.23 kg/m   Visual Acuity Right Eye Distance:   Left Eye Distance:   Bilateral Distance:    Right Eye Near:   Left Eye Near:    Bilateral Near:     Physical Exam Vitals and nursing note reviewed.  Constitutional:      Appearance: Normal appearance. He is not ill-appearing or toxic-appearing.     Comments: Very pleasant patient sitting on exam in position of comfort table in no acute distress.   HENT:     Head: Normocephalic and atraumatic.     Right Ear: Hearing, tympanic membrane, ear canal and external ear normal.     Left Ear: Hearing, tympanic membrane and external ear normal. Drainage, swelling and tenderness present. Tympanic membrane is not perforated.     Ears:     Comments: Copious purulent drainage to the left ear canal that is thick present.  TM is intact although there is erythema to the ear canal.  Tenderness with movement of the pinna present.    Nose: Nose normal.     Mouth/Throat:     Lips: Pink.     Mouth: Mucous membranes are moist.  Eyes:     General: Lids  are normal. Vision grossly intact. Gaze aligned appropriately.     Extraocular Movements: Extraocular movements intact.     Conjunctiva/sclera: Conjunctivae normal.  Pulmonary:     Effort: Pulmonary effort is normal.  Abdominal:     Palpations: Abdomen is soft.  Musculoskeletal:     Cervical back: Neck supple.  Skin:    General: Skin is warm and dry.     Capillary Refill: Capillary refill takes less than 2 seconds.     Findings: No rash.  Neurological:     General: No focal deficit present.     Mental Status: He is alert and oriented to person, place, and time. Mental status is at baseline.     Cranial Nerves: No dysarthria or facial asymmetry.     Gait: Gait is intact.  Psychiatric:        Mood and Affect: Mood normal.        Speech: Speech normal.        Behavior: Behavior normal.        Thought Content: Thought content normal.        Judgment: Judgment normal.      UC Treatments / Results  Labs (all labs ordered are listed, but only abnormal results are displayed) Labs Reviewed - No data to display  EKG   Radiology No results found.  Procedures Procedures (including critical care time)  Medications Ordered in UC Medications - No data to display  Initial Impression / Assessment and Plan / UC Course  I have reviewed the triage vital signs and the nursing notes.  Pertinent labs & imaging results that were available during my care of the patient were reviewed by me and considered in my medical decision making (see chart for details).   1.  Otitis externa of the left ear Patient has not responded well to ofloxacin eardrops for the last 2 days and therefore we will begin treatment with oral antibiotic with coverage for Pseudomonas.  Patient to take Augmentin twice daily for the next 7 days to treat significant otitis externa infection.  Advised patient to take this medicine with food to avoid to make upset.  He may take ibuprofen as needed to help with the pain to the  left ear.  He may also take Zyrtec as  needed to help with some of the pressure behind the left eardrum.  Advised to avoid submerging head underwater for at least 2 or 3 weeks as infection heals and keep water out of the ear canal.  Patient agreeable with this plan.  Denies allergies to antibiotics.   Discussed physical exam and available lab work findings in clinic with patient.  Counseled patient regarding appropriate use of medications and potential side effects for all medications recommended or prescribed today. Discussed red flag signs and symptoms of worsening condition,when to call the PCP office, return to urgent care, and when to seek higher level of care in the emergency department. Patient verbalizes understanding and agreement with plan. All questions answered. Patient discharged in stable condition.  Final Clinical Impressions(s) / UC Diagnoses   Final diagnoses:  Other infective acute otitis externa of left ear     Discharge Instructions      Take Augmentin antibiotic twice daily for the next 7 days to treat your severe otitis externa infection.  Take this medicine with food to avoid stomach upset.  You may take Zyrtec as needed to help with some of the pressure behind your eardrum.  Take ibuprofen as needed to help with pain to the left ear.  Do not submerge your head underwater for the next 2 weeks while infection heals.  If you develop any new or worsening symptoms or do not improve in the next 2 to 3 days, please return.  If your symptoms are severe, please go to the emergency room.  Follow-up with your primary care provider for further evaluation and management of your symptoms as well as ongoing wellness visits.  I hope you feel better!   ED Prescriptions     Medication Sig Dispense Auth. Provider   amoxicillin-clavulanate (AUGMENTIN) 875-125 MG tablet Take 1 tablet by mouth every 12 (twelve) hours. 14 tablet Carlisle Beers, FNP      PDMP not reviewed this  encounter.   Carlisle Beers, [place] 11/24/21 1459

## 2021-11-24 NOTE — Discharge Instructions (Addendum)
Take Augmentin antibiotic twice daily for the next 7 days to treat your severe otitis externa infection.  Take this medicine with food to avoid stomach upset.  You may take Zyrtec as needed to help with some of the pressure behind your eardrum.  Take ibuprofen as needed to help with pain to the left ear.  Do not submerge your head underwater for the next 2 weeks while infection heals.  If you develop any new or worsening symptoms or do not improve in the next 2 to 3 days, please return.  If your symptoms are severe, please go to the emergency room.  Follow-up with your primary care provider for further evaluation and management of your symptoms as well as ongoing wellness visits.  I hope you feel better!

## 2022-02-06 ENCOUNTER — Other Ambulatory Visit: Payer: Self-pay

## 2022-02-06 ENCOUNTER — Ambulatory Visit (HOSPITAL_COMMUNITY)
Admission: EM | Admit: 2022-02-06 | Discharge: 2022-02-06 | Disposition: A | Discharge status: Home / Self Care | Payer: Self-pay | Attending: Internal Medicine | Admitting: Internal Medicine

## 2022-02-06 ENCOUNTER — Encounter (HOSPITAL_COMMUNITY): Payer: Self-pay | Admitting: Emergency Medicine

## 2022-02-06 DIAGNOSIS — K602 Anal fissure, unspecified: Secondary | ICD-10-CM

## 2022-02-06 MED ORDER — HYDROCORTISONE ACETATE 25 MG RE SUPP: 25.0000 mg | Freq: Two times a day (BID) | RECTAL | 0 refills | Status: DC

## 2022-02-06 MED ORDER — LIDOCAINE (ANORECTAL) 5 % EX GEL: CUTANEOUS | 0 refills | Status: DC

## 2022-02-06 NOTE — Discharge Instructions (Addendum)
Please take a stool softener These increase vegetable and fruit intake Please take a stool softener as needed Use medications as recommended Return to urgent care if you have persistent or worsening symptoms.

## 2022-02-06 NOTE — ED Triage Notes (Signed)
Reports rectal pain that occurred while having a bowel movement 2 days ago.    Denies abdominal pain.  Patient is having rectal pain.  Patient has had another bm since incident, this was this morning .  Having BM was very painful.    Has been using preparation H .

## 2022-02-06 NOTE — Medical Student Note (Signed)
[facility] Insurance account manager Note For educational purposes for Medical, PA and NP students only and not part of the legal medical record.   CSN: [id number] Arrival date & time: 02/06/22  1406      History   Chief Complaint Chief Complaint  Patient presents with   Abdominal Pain    HPI Ryan Mckenzie is a 26 y.o. male.  Ryan Mckenzie presents with a two day history of rectal pain. Reports he was having a bowel movement when he felt a sharp, tearing pain in his rectal area. Pain increases with straining during bowel movements and heavy lifting at work. Has been using preparation H ointments over the area without improvement in symptoms. Reports seeing small streaks of bright red blood on the toilet paper when he wipes, denies large quantities of blood in the toilet bowel or noticing any reducible lump in that area. Denies fever, abdominal pain, N/V/D, recent constipation, urinary problems, trauma to area, history of same.   Abdominal Pain   History reviewed. No pertinent past medical history.  Patient Active Problem List   Diagnosis Date Noted   Alcohol abuse 08/21/2021    History reviewed. No pertinent surgical history.     Home Medications    Prior to Admission medications   Medication Sig Start Date End Date Taking? Authorizing Provider  hydrocortisone (ANUSOL-HC) 25 MG suppository Place 1 suppository (25 mg total) rectally 2 (two) times daily. 02/06/22  Yes Lamptey, Britta Mccreedy, MD  Lidocaine, Anorectal, 5 % GEL Please apply to the affected area as needed. 02/06/22  Yes Lamptey, Britta Mccreedy, MD    Family History Family History  Problem Relation Age of Onset   COPD Mother    Healthy Brother     Social History Social History   Tobacco Use   Smoking status: Never    Passive exposure: Yes   Smokeless tobacco: Never  Vaping Use   Vaping Use: Every day  Substance Use Topics   Alcohol use: No   Drug use: No     Allergies   Patient has no known  allergies.   Review of Systems Review of Systems  Gastrointestinal:  Positive for abdominal pain.  All other systems reviewed and are negative.  ROS in HPI  Physical Exam Updated Vital Signs BP (!) 130/93 (BP Location: Right Arm)   Pulse 78   Temp 98.2 F (36.8 C) (Oral)   Resp 20   SpO2 99%   Physical Exam Vitals and nursing note reviewed.  Constitutional:      Appearance: He is well-developed.  Abdominal:     Palpations: Abdomen is soft.     Tenderness: There is no abdominal tenderness. There is no guarding.     Hernia: No hernia is present.  Genitourinary:    Comments: Pain elicited when examining the rectal area, patient also had a copious amount of preparation H ointment coating area, making visualization of potential fissure difficult. No obvious hemorrhoid, mass, protuberance, abscess visible around site. Skin:    General: Skin is warm and dry.  Neurological:     General: No focal deficit present.     Mental Status: He is alert and oriented to person, place, and time.      ED Treatments / Results  Labs (all labs ordered are listed, but only abnormal results are displayed) Labs Reviewed - No data to display  EKG  Radiology No results found.  Procedures Procedures (including critical care time)  Medications Ordered in ED Medications -  No data to display   Initial Impression / Assessment and Plan / ED Course  I have reviewed the triage vital signs and the nursing notes.  Pertinent labs & imaging results that were available during my care of the patient were reviewed by me and considered in my medical decision making (see chart for details).     Suspect anal fissure with clinical presentation, suggesting hydrocortisone suppository and OTC stool softener to help with resolution of symptoms. Discussed ER protocol, follow up with PCP if no improvement or with new/worsening symptoms.  Final Clinical Impressions(s) / ED Diagnoses   Final diagnoses:   Anal fissure    New Prescriptions New Prescriptions   HYDROCORTISONE (ANUSOL-HC) 25 MG SUPPOSITORY    Place 1 suppository (25 mg total) rectally 2 (two) times daily.   LIDOCAINE, ANORECTAL, 5 % GEL    Please apply to the affected area as needed.

## 2022-02-06 NOTE — ED Provider Notes (Signed)
[facility]    CSN: [id number] Arrival date & time: 02/06/22  1406      History   Chief Complaint Chief Complaint  Patient presents with   Abdominal Pain    HPI Ryan Mckenzie is a 26 y.o. male comes to the urgent care with anal pain of 2 days duration.  Patient says pain started fairly abruptly while was having a bowel movement 2 days ago.  He endorses no bleeding when he wipes.  He denies any history of constipation.  Over the course of 2 days the pain has been constant, sharp, throbbing and aggravated by having a bowel movement.  He denies any dripping of blood into the toilet bowl.  No abdominal distention.  No nausea or vomiting.  No rectal discharge.  He has tried Preparation H with no improvement in symptoms.  No fever or chills.   HPI  History reviewed. No pertinent past medical history.  Patient Active Problem List   Diagnosis Date Noted   Alcohol abuse 08/21/2021    History reviewed. No pertinent surgical history.     Home Medications    Prior to Admission medications   Medication Sig Start Date End Date Taking? Authorizing Provider  hydrocortisone (ANUSOL-HC) 25 MG suppository Place 1 suppository (25 mg total) rectally 2 (two) times daily. 02/06/22  Yes Xavious Sharrar, Britta Mccreedy, MD  Lidocaine, Anorectal, 5 % GEL Please apply to the affected area as needed. 02/06/22  Yes Karilynn Carranza, Britta Mccreedy, MD    Family History Family History  Problem Relation Age of Onset   COPD Mother    Healthy Brother     Social History Social History   Tobacco Use   Smoking status: Never    Passive exposure: Yes   Smokeless tobacco: Never  Vaping Use   Vaping Use: Every day  Substance Use Topics   Alcohol use: No   Drug use: No     Allergies   Patient has no known allergies.   Review of Systems Review of Systems  Respiratory: Negative.    Gastrointestinal:  Positive for rectal pain.  Genitourinary: Negative.   Musculoskeletal: Negative.      Physical  Exam Triage Vital Signs ED Triage Vitals  Enc Vitals Group     BP 02/06/22 1537 (!) 130/93     Pulse Rate 02/06/22 1537 78     Resp 02/06/22 1537 20     Temp 02/06/22 1537 98.2 F (36.8 C)     Temp Source 02/06/22 1537 Oral     SpO2 02/06/22 1537 99 %     Weight --      Height --      Head Circumference --      Peak Flow --      Pain Score 02/06/22 1535 8     Pain Loc --      Pain Edu? --      Excl. in GC? --    No data found.  Updated Vital Signs BP (!) 130/93 (BP Location: Right Arm)   Pulse 78   Temp 98.2 F (36.8 C) (Oral)   Resp 20   SpO2 99%   Visual Acuity Right Eye Distance:   Left Eye Distance:   Bilateral Distance:    Right Eye Near:   Left Eye Near:    Bilateral Near:     Physical Exam Vitals and nursing note reviewed. Exam conducted with a chaperone present.  Constitutional:      General: He is in  acute distress.     Appearance: He is not ill-appearing.  Abdominal:     General: There is no distension or abdominal bruit.     Palpations: Abdomen is soft.  Skin:    Comments: Anal exam was limited by patient's discomfort.  He was very tense so I could not fully visualize the perianal area.  Neurological:     Mental Status: He is alert.      UC Treatments / Results  Labs (all labs ordered are listed, but only abnormal results are displayed) Labs Reviewed - No data to display  EKG   Radiology No results found.  Procedures Procedures (including critical care time)  Medications Ordered in UC Medications - No data to display  Initial Impression / Assessment and Plan / UC Course  I have reviewed the triage vital signs and the nursing notes.  Pertinent labs & imaging results that were available during my care of the patient were reviewed by me and considered in my medical decision making (see chart for details).     1.  Anal fissure: Anusol suppositories twice daily Lidocaine gel 5% to be applied to the perirectal area as  needed Over-the-counter ibuprofen or Tylenol as needed for pain Return precautions given Patient is advised to take stool softeners and increase oral fiber intake. Return precautions given. Final Clinical Impressions(s) / UC Diagnoses   Final diagnoses:  Anal fissure     Discharge Instructions      Please take a stool softener These increase vegetable and fruit intake Please take a stool softener as needed Use medications as recommended Return to urgent care if you have persistent or worsening symptoms.   ED Prescriptions     Medication Sig Dispense Auth. Provider   hydrocortisone (ANUSOL-HC) 25 MG suppository Place 1 suppository (25 mg total) rectally 2 (two) times daily. 12 suppository Junior Kenedy, Britta Mccreedy, MD   Lidocaine, Anorectal, 5 % GEL Please apply to the affected area as needed. 10 g Brayam Boeke, Britta Mccreedy, MD      PDMP not reviewed this encounter.   Merrilee Jansky, MD 02/06/22 [phone]

## 2022-03-05 ENCOUNTER — Emergency Department (HOSPITAL_COMMUNITY)
Admission: EM | Admit: 2022-03-05 | Discharge: 2022-03-06 | Disposition: A | Discharge status: Home / Self Care | Payer: Self-pay | Attending: Emergency Medicine | Admitting: Emergency Medicine

## 2022-03-05 ENCOUNTER — Emergency Department (HOSPITAL_COMMUNITY): Payer: Self-pay

## 2022-03-05 DIAGNOSIS — M79671 Pain in right foot: Secondary | ICD-10-CM | POA: Insufficient documentation

## 2022-03-05 NOTE — ED Provider Triage Note (Signed)
Emergency Medicine Provider Triage Evaluation Note  Ryan Mckenzie , a 26 y.o. male  was evaluated in triage.  Pt complains of left foot pain going on 1 day, states pain is mainly on the dorsum aspect of his foot around the fourth through fifth MTP joints, pain is worsened with ambulation improved with rest, no traumatic injury, no fevers no chills no history of IV drug use, has never had this in the past..  Review of Systems  Positive: Foot pain, paresthesias Negative: Chest pain, shortness of breath  Physical Exam  There were no vitals taken for this visit. Gen:   Awake, no distress   Resp:  Normal effort  MSK:   Moves extremities without difficulty  Other:    Medical Decision Making  Medically screening exam initiated at 11:29 PM.  Appropriate orders placed.  Khadim T Jeffries was informed that the remainder of the evaluation will be completed by another provider, this initial triage assessment does not replace that evaluation, and the importance of remaining in the ED until their evaluation is complete.  Imaging of been ordered will need further workup.   Carroll Sage, PA-C 03/05/22 2330

## 2022-03-05 NOTE — ED Triage Notes (Signed)
R foot pain with burning sensation. Pain started this morning on bottom of foot. Denies injury. Does stand on his feet for work for prolonged periods.

## 2022-03-06 MED ORDER — IBUPROFEN 400 MG PO TABS: 600.0000 mg | ORAL_TABLET | Freq: Once | ORAL | Status: DC

## 2022-03-06 MED ORDER — IBUPROFEN 400 MG PO TABS
600.0000 mg | ORAL_TABLET | Freq: Once | ORAL | Status: AC
  Administered 2022-03-06: 600 mg via ORAL
  Filled 2022-03-06: qty 1

## 2022-03-06 NOTE — Discharge Instructions (Addendum)
For now, use the shoe given in the emergency department to help with comfort.  Use ibuprofen and Tylenol for pain and discomfort.  Follow-up with the podiatrist listed next week if not improving.  If at any point you develop fever, inability to bear weight, redness or warmth, severe or unrelenting pain, or any other new/concerning symptoms and return to the ER for evaluation.

## 2022-03-06 NOTE — ED Provider Notes (Signed)
[facility] EMERGENCY DEPARTMENT Provider Note   CSN: [id number] Arrival date & time: 03/05/22  2255     History  Chief Complaint  Patient presents with   Foot Pain    Ryan Mckenzie is a 26 y.o. male.  HPI 26 year old male presents with right foot pain.  When he woke up on 12/13 and stepped out of bed he felt a sudden pop and pain to the bottom of his foot.  Is primarily lateral and plantar.  Perhaps some mild swelling.  Has pain going into his pinky toe.  No erythema or fever.  He took some Tylenol.  He is able to walk on it but it is painful.  Home Medications Prior to Admission medications   Medication Sig Start Date End Date Taking? Authorizing Provider  hydrocortisone (ANUSOL-HC) 25 MG suppository Place 1 suppository (25 mg total) rectally 2 (two) times daily. 02/06/22   LampteyBritta Mccreedy, MD  Lidocaine, Anorectal, 5 % GEL Please apply to the affected area as needed. 02/06/22   Lamptey, Britta Mccreedy, MD      Allergies    Patient has no known allergies.    Review of Systems   Review of Systems  Musculoskeletal:  Positive for arthralgias.  Skin:  Negative for color change.  Neurological:  Negative for numbness.    Physical Exam Updated Vital Signs BP 134/85 (BP Location: Right Arm)   Pulse 71   Temp 98.2 F (36.8 C)   Resp 19   SpO2 99%  Physical Exam Vitals and nursing note reviewed.  Constitutional:      Appearance: He is well-developed.  HENT:     Head: Normocephalic and atraumatic.  Cardiovascular:     Rate and Rhythm: Normal rate and regular rhythm.     Pulses:          Dorsalis pedis pulses are 2+ on the right side.  Pulmonary:     Effort: Pulmonary effort is normal.  Musculoskeletal:     Right ankle: No tenderness. Normal range of motion.     Right Achilles Tendon: No tenderness or defects.     Right foot: Tenderness present.       Feet:  Feet:     Right foot:     Skin integrity: No erythema.     Comments: No erythema,  fluctuance. Normal ROM of toes.  Skin:    General: Skin is warm and dry.  Neurological:     Mental Status: He is alert.     ED Results / Procedures / Treatments   Labs (all labs ordered are listed, but only abnormal results are displayed) Labs Reviewed - No data to display  EKG None  Radiology DG Foot Complete Right  Result Date: 03/05/2022 CLINICAL DATA:  Listed history of injury. Patient reports right foot burning sensation, onset this morning. No reported injury. EXAM: RIGHT FOOT COMPLETE - 3+ VIEW COMPARISON:  None Available. FINDINGS: There is no evidence of fracture or dislocation. Normal joint spaces and alignment. There is no evidence of arthropathy or other focal bone abnormality. Soft tissues are unremarkable. IMPRESSION: Negative radiographs of the right foot. Electronically Signed   By: Narda Rutherford M.D.   On: 03/05/2022 23:45    Procedures Procedures    Medications Ordered in ED Medications  ibuprofen (ADVIL) tablet 600 mg (has no administration in time range)    ED Course/ Medical Decision Making/ A&P  Medical Decision Making  X-rays images from triage reviewed/interpreted by myself.  No fracture or dislocation of the foot.  Sounds like a soft tissue injury from when he stood up.  I do not feel an obvious defect such as plantar fascial tear.  He is able to ambulate without assistance, I personally viewed this.  No obvious signs of an infection.  Will recommend NSAIDs plus Tylenol, will give postop shoe for comfort, and will refer to podiatry if he is still having pain next week.  Otherwise, doubt of infection, neurovascular compromise, occult fracture, etc.        Final Clinical Impression(s) / ED Diagnoses Final diagnoses:  Arch pain of right foot    Rx / DC Orders ED Discharge Orders     None         Pricilla Loveless, MD 03/06/22 [phone]

## 2022-06-10 ENCOUNTER — Ambulatory Visit
Admission: EM | Admit: 2022-06-10 | Discharge: 2022-06-10 | Disposition: A | Discharge status: Home / Self Care | Payer: BC Managed Care – PPO | Attending: Internal Medicine | Admitting: Internal Medicine

## 2022-06-10 ENCOUNTER — Other Ambulatory Visit: Payer: Self-pay

## 2022-06-10 ENCOUNTER — Encounter: Payer: Self-pay | Admitting: *Deleted

## 2022-06-10 DIAGNOSIS — H65191 Other acute nonsuppurative otitis media, right ear: Secondary | ICD-10-CM | POA: Diagnosis not present

## 2022-06-10 MED ORDER — AMOXICILLIN-POT CLAVULANATE 875-125 MG PO TABS: 1.0000 | ORAL_TABLET | Freq: Two times a day (BID) | ORAL | 0 refills | Status: DC

## 2022-06-10 NOTE — ED Provider Notes (Signed)
[facility]    CSN: [id number] Arrival date & time: 06/10/22  1159      History   Chief Complaint Chief Complaint  Patient presents with   Otalgia    HPI Ryan Mckenzie is a 27 y.o. male.   Patient presents with right ear pain that started today.  He reports that he has been having some cough and nasal congestion over the past week that is improved that could be associated to ear pain.  Reports he is concerned about his right ear pain that started today.  Denies any recent fevers.  Patient has taken Tylenol for ear pain with minimal improvement.  Denies trauma, foreign body, decreased hearing, drainage from the ear.   Otalgia   History reviewed. No pertinent past medical history.  Patient Active Problem List   Diagnosis Date Noted   Alcohol abuse 08/21/2021    History reviewed. No pertinent surgical history.     Home Medications    Prior to Admission medications   Medication Sig Start Date End Date Taking? Authorizing Provider  amoxicillin-clavulanate (AUGMENTIN) 875-125 MG tablet Take 1 tablet by mouth every 12 (twelve) hours. 06/10/22  Yes Naim Murtha, Michele Rockers, FNP  hydrocortisone (ANUSOL-HC) 25 MG suppository Place 1 suppository (25 mg total) rectally 2 (two) times daily. 02/06/22   LampteyMyrene Galas, MD  Lidocaine, Anorectal, 5 % GEL Please apply to the affected area as needed. 02/06/22   Lamptey, Myrene Galas, MD    Family History Family History  Problem Relation Age of Onset   COPD Mother    Healthy Brother     Social History Social History   Tobacco Use   Smoking status: Never    Passive exposure: Yes   Smokeless tobacco: Never  Vaping Use   Vaping Use: Every day  Substance Use Topics   Alcohol use: No   Drug use: No     Allergies   Patient has no known allergies.   Review of Systems Review of Systems Per HPI  Physical Exam Triage Vital Signs ED Triage Vitals [06/10/22 1336]  Enc Vitals Group     BP (!) 150/82     Pulse Rate 69      Resp 20     Temp 98.4 F (36.9 C)     Temp src      SpO2 99 %     Weight      Height      Head Circumference      Peak Flow      Pain Score 10     Pain Loc      Pain Edu?      Excl. in [place]?    No data found.  Updated Vital Signs BP (!) 150/82   Pulse 69   Temp 98.4 F (36.9 C)   Resp 20   SpO2 99%   Visual Acuity Right Eye Distance:   Left Eye Distance:   Bilateral Distance:    Right Eye Near:   Left Eye Near:    Bilateral Near:     Physical Exam Constitutional:      General: He is not in acute distress.    Appearance: Normal appearance. He is not toxic-appearing or diaphoretic.  HENT:     Head: Normocephalic and atraumatic.     Right Ear: Tympanic membrane is erythematous. Tympanic membrane is not perforated or bulging.     Left Ear: Tympanic membrane is not perforated, erythematous or bulging.  Eyes:  Extraocular Movements: Extraocular movements intact.     Conjunctiva/sclera: Conjunctivae normal.  Pulmonary:     Effort: Pulmonary effort is normal.  Neurological:     General: No focal deficit present.     Mental Status: He is alert and oriented to person, place, and time. Mental status is at baseline.  Psychiatric:        Mood and Affect: Mood normal.        Behavior: Behavior normal.        Thought Content: Thought content normal.        Judgment: Judgment normal.      UC Treatments / Results  Labs (all labs ordered are listed, but only abnormal results are displayed) Labs Reviewed - No data to display  EKG   Radiology No results found.  Procedures Procedures (including critical care time)  Medications Ordered in UC Medications - No data to display  Initial Impression / Assessment and Plan / UC Course  I have reviewed the triage vital signs and the nursing notes.  Pertinent labs & imaging results that were available during my care of the patient were reviewed by me and considered in my medical decision making (see chart for  details).     Physical exam is consistent with right otitis media.  Patient reports he was treated with antibiotic about 2 months ago for a different ear infection.  Therefore, will treat with Augmentin antibiotic.  Advised supportive care and symptom management.  Advised strict follow-up precautions.  Patient verbalized understanding and was agreeable with plan. Final Clinical Impressions(s) / UC Diagnoses   Final diagnoses:  Other non-recurrent acute nonsuppurative otitis media of right ear     Discharge Instructions      You have an ear infection in the right ear which is being treated with an antibiotic.  Follow-up if any symptoms persist or worsen.    ED Prescriptions     Medication Sig Dispense Auth. Provider   amoxicillin-clavulanate (AUGMENTIN) 875-125 MG tablet Take 1 tablet by mouth every 12 (twelve) hours. 14 tablet [place], Michele Rockers, [place]      PDMP not reviewed this encounter.   Teodora Medici, [place] 06/10/22 1440

## 2022-06-10 NOTE — ED Triage Notes (Signed)
Pt reports rt ear pain started at work today.Pain 10/10.

## 2022-06-10 NOTE — Discharge Instructions (Signed)
You have an ear infection in the right ear which is being treated with an antibiotic.  Follow-up if any symptoms persist or worsen.

## 2022-06-26 ENCOUNTER — Ambulatory Visit (INDEPENDENT_AMBULATORY_CARE_PROVIDER_SITE_OTHER): Payer: BC Managed Care – PPO | Admitting: Family Medicine

## 2022-06-26 ENCOUNTER — Encounter: Payer: Self-pay | Admitting: Family Medicine

## 2022-06-26 VITALS — BP 129/85 | HR 76 | Temp 98.0°F | Resp 16 | Ht 67.0 in | Wt 198.0 lb

## 2022-06-26 DIAGNOSIS — F32A Depression, unspecified: Secondary | ICD-10-CM | POA: Diagnosis not present

## 2022-06-26 DIAGNOSIS — F419 Anxiety disorder, unspecified: Secondary | ICD-10-CM | POA: Diagnosis not present

## 2022-06-26 DIAGNOSIS — Z7689 Persons encountering health services in other specified circumstances: Secondary | ICD-10-CM

## 2022-06-26 MED ORDER — SERTRALINE HCL 25 MG PO TABS: 25.0000 mg | ORAL_TABLET | Freq: Every day | ORAL | 1 refills | Status: DC

## 2022-06-26 MED ORDER — HYDROXYZINE PAMOATE 25 MG PO CAPS: 25.0000 mg | ORAL_CAPSULE | Freq: Three times a day (TID) | ORAL | 1 refills | Status: DC | PRN

## 2022-06-26 NOTE — Progress Notes (Signed)
New Patient Office Visit  Subjective    Patient ID: Ryan Mckenzie, male    DOB: [birth year]  Age: 27 y.o. MRN: [id number]  CC:  Chief Complaint  Patient presents with   Establish Care    HPI Ryan Mckenzie presents to establish care and with complaint of anxiety. He has started meds before but lost provider and insurance and did not continue.    Outpatient Encounter Medications as of 06/26/2022  Medication Sig   hydrOXYzine (VISTARIL) 25 MG capsule Take 1 capsule (25 mg total) by mouth every 8 (eight) hours as needed.   sertraline (ZOLOFT) 25 MG tablet Take 1 tablet (25 mg total) by mouth daily.   [DISCONTINUED] amoxicillin-clavulanate (AUGMENTIN) 875-125 MG tablet Take 1 tablet by mouth every 12 (twelve) hours.   [DISCONTINUED] hydrocortisone (ANUSOL-HC) 25 MG suppository Place 1 suppository (25 mg total) rectally 2 (two) times daily.   [DISCONTINUED] Lidocaine, Anorectal, 5 % GEL Please apply to the affected area as needed.   No facility-administered encounter medications on file as of 06/26/2022.    History reviewed. No pertinent past medical history.  History reviewed. No pertinent surgical history.  Family History  Problem Relation Age of Onset   COPD Mother    Healthy Brother     Social History   Socioeconomic History   Marital status: Single    Spouse name: Not on file   Number of children: Not on file   Years of education: Not on file   Highest education level: Not on file  Occupational History   Not on file  Tobacco Use   Smoking status: Never    Passive exposure: Yes   Smokeless tobacco: Never  Vaping Use   Vaping Use: Every day  Substance and Sexual Activity   Alcohol use: No   Drug use: No   Sexual activity: Never    Birth control/protection: Abstinence  Other Topics Concern   Not on file  Social History Narrative   Not on file   Social Determinants of Health   Financial Resource Strain: Not on file  Food Insecurity: Not on file   Transportation Needs: Not on file  Physical Activity: Not on file  Stress: Not on file  Social Connections: Not on file  Intimate Partner Violence: Not on file    Review of Systems  Psychiatric/Behavioral:  Positive for depression. Negative for suicidal ideas. The patient is nervous/anxious.   All other systems reviewed and are negative.       Objective    BP 129/85   Pulse 76   Temp 98 F (36.7 C) (Oral)   Resp 16   Ht 5\' 7"  (1.702 m)   Wt 198 lb (89.8 kg)   SpO2 97%   BMI 31.01 kg/m   Physical Exam Vitals and nursing note reviewed.  Constitutional:      General: He is not in acute distress. Cardiovascular:     Rate and Rhythm: Normal rate and regular rhythm.  Pulmonary:     Effort: Pulmonary effort is normal.     Breath sounds: Normal breath sounds.  Neurological:     General: No focal deficit present.     Mental Status: He is alert and oriented to person, place, and time.  Psychiatric:        Mood and Affect: Affect normal. Mood is anxious.        Speech: Speech normal.        Behavior: Behavior normal. Behavior is cooperative.  Assessment & Plan:   1. Anxiety and depression Zoloft 25 mg prescribed as well as hydroxyzine. [facility].   2. Encounter to establish care   Return in about 4 weeks (around 07/24/2022) for follow up.   Tommie RaymondWilson, Haileigh Pitz P, MD

## 2022-06-26 NOTE — Progress Notes (Signed)
Patient is here to established care with provider today. Patient has many health concern they would like to discuss with provider today  Care gaps discuss at appointment today  

## 2022-07-07 NOTE — Progress Notes (Unsigned)
Patient ID: BOB DAVERSA, male    DOB: [birth year]  MRN: [id number]  CC: Thyroid Check   Subjective: Ryan Mckenzie is a 27 y.o. male who presents for thyroid check.   His concerns today include:  - Patient reports over 1 year ago he was told by a physician at the hospital that his thyroid is enlarged. Reports he was referred to Endocrinology at that time. However, reports since then he was unable to establish with them. Reports he was never prescribed thyroid medication. Reports currently sometimes he feels "neck pressure". He denies associated red flag symptoms.  - Reports he is doing ok on anxiety medication. He recently had an appointment with his primary provider Georganna Skeans, MD for management of the same. He plans to follow-up with his primary provider soon. He denies thoughts of self-harm, suicidal ideations, homicidal ideations. - No further issues/concerns for discussion today.    Patient Active Problem List   Diagnosis Date Noted   Alcohol abuse 08/21/2021     Current Outpatient Medications on File Prior to Visit  Medication Sig Dispense Refill   hydrOXYzine (VISTARIL) 25 MG capsule Take 1 capsule (25 mg total) by mouth every 8 (eight) hours as needed. 30 capsule 1   sertraline (ZOLOFT) 25 MG tablet Take 1 tablet (25 mg total) by mouth daily. 30 tablet 1   No current facility-administered medications on file prior to visit.    No Known Allergies  Social History   Socioeconomic History   Marital status: Single    Spouse name: Not on file   Number of children: Not on file   Years of education: Not on file   Highest education level: Not on file  Occupational History   Not on file  Tobacco Use   Smoking status: Never    Passive exposure: Yes   Smokeless tobacco: Never  Vaping Use   Vaping Use: Every day  Substance and Sexual Activity   Alcohol use: No   Drug use: No   Sexual activity: Never    Birth control/protection: Abstinence  Other Topics Concern   Not  on file  Social History Narrative   Not on file   Social Determinants of Health   Financial Resource Strain: Not on file  Food Insecurity: Not on file  Transportation Needs: Not on file  Physical Activity: Not on file  Stress: Not on file  Social Connections: Not on file  Intimate Partner Violence: Not on file    Family History  Problem Relation Age of Onset   COPD Mother    Healthy Brother     No past surgical history on file.  ROS: Review of Systems Negative except as stated above  PHYSICAL EXAM: BP 127/80 (BP Location: Left Arm, Patient Position: Sitting, Cuff Size: Normal)   Pulse 90   Temp 98.6 F (37 C) (Oral)   Ht  (1.702 m)   Wt 197 lb (89.4 kg)   SpO2 98%   BMI 30.85 kg/m   Physical Exam HENT:     Head: Normocephalic and atraumatic.     Nose: Nose normal.     Mouth/Throat:     Mouth: Mucous membranes are moist.     Pharynx: Oropharynx is clear.  Eyes:     Extraocular Movements: Extraocular movements intact.     Conjunctiva/sclera: Conjunctivae normal.     Pupils: Pupils are equal, round, and reactive to light.  Cardiovascular:     Rate and Rhythm: Normal rate and regular  rhythm.     Pulses: Normal pulses.     Heart sounds: Normal heart sounds.  Pulmonary:     Effort: Pulmonary effort is normal.     Breath sounds: Normal breath sounds.  Musculoskeletal:     Cervical back: Normal range of motion and neck supple.  Neurological:     General: No focal deficit present.     Mental Status: He is alert and oriented to person, place, and time.  Psychiatric:        Mood and Affect: Mood normal.        Behavior: Behavior normal.    ASSESSMENT AND PLAN: 1. Enlarged thyroid 2. Thyroid disorder screen - Routine screening.  - Ultrasound thyroid for further evaluation.  - Referral to Endocrinology for further evaluation/management. During the interim follow-up with primary provider as scheduled.  - TSH - US THYROID; Future - Ambulatory referral  to Endocrinology    Patient was given the opportunity to ask questions.  Patient verbalized understanding of the plan and was able to repeat key elements of the plan. Patient was given clear instructions to go to Emergency Department or return to medical center if symptoms don't improve, worsen, or new problems develop.The patient verbalized understanding.   Orders Placed This Encounter  Procedures   US THYROID   TSH   Ambulatory referral to Endocrinology     Return in about 4 weeks (around 08/05/2022) for Follow-Up or next available Georganna Skeans, MD.  Rema Fendt, NP

## 2022-07-08 ENCOUNTER — Ambulatory Visit (INDEPENDENT_AMBULATORY_CARE_PROVIDER_SITE_OTHER): Payer: BC Managed Care – PPO | Admitting: Family

## 2022-07-08 ENCOUNTER — Encounter: Payer: Self-pay | Admitting: Family

## 2022-07-08 VITALS — BP 127/80 | HR 90 | Temp 98.6°F | Ht 67.0 in | Wt 197.0 lb

## 2022-07-08 DIAGNOSIS — Z1329 Encounter for screening for other suspected endocrine disorder: Secondary | ICD-10-CM | POA: Diagnosis not present

## 2022-07-08 DIAGNOSIS — E049 Nontoxic goiter, unspecified: Secondary | ICD-10-CM | POA: Diagnosis not present

## 2022-07-08 NOTE — Progress Notes (Unsigned)
Thyroid concerns - Pt previously told that lab work showed thyroid was enlarged / swollen, feeling discomfort Experiencing worsening anxiety & panic attacks

## 2022-07-09 LAB — TSH: TSH: 3 u[IU]/mL (ref 0.450–4.500)

## 2022-07-10 ENCOUNTER — Telehealth: Payer: Self-pay

## 2022-07-10 NOTE — Telephone Encounter (Signed)
Pt returned call for lab results. Shared provider's note. No questions.  Rema Fendt, NP 07/09/2022  9:38 AM EDT     Normal. Continue with plan discussed in office.

## 2022-07-21 NOTE — Telephone Encounter (Signed)
Copied from CRM [id number]. Topic: General - Inquiry >> Jul 21, 2022  9:14 AM Patsy Lager T wrote: Reason for CRM: Osborne Casco called from physicians office and need a copy of patients insurance card faxed to attn: [facility] at [phone].

## 2022-07-21 NOTE — Telephone Encounter (Signed)
Patient is aware of msg form physicians office.

## 2022-07-28 ENCOUNTER — Ambulatory Visit (INDEPENDENT_AMBULATORY_CARE_PROVIDER_SITE_OTHER): Payer: BC Managed Care – PPO | Admitting: Family Medicine

## 2022-07-28 ENCOUNTER — Encounter: Payer: Self-pay | Admitting: Family Medicine

## 2022-07-28 VITALS — BP 125/84 | HR 80 | Temp 97.1°F | Resp 16 | Wt 197.0 lb

## 2022-07-28 DIAGNOSIS — F32A Depression, unspecified: Secondary | ICD-10-CM

## 2022-07-28 DIAGNOSIS — F419 Anxiety disorder, unspecified: Secondary | ICD-10-CM | POA: Diagnosis not present

## 2022-07-28 MED ORDER — HYDROXYZINE PAMOATE 25 MG PO CAPS: 25.0000 mg | ORAL_CAPSULE | Freq: Three times a day (TID) | ORAL | 2 refills | Status: AC | PRN

## 2022-07-28 MED ORDER — SERTRALINE HCL 50 MG PO TABS: 50.0000 mg | ORAL_TABLET | Freq: Every day | ORAL | 1 refills | Status: DC

## 2022-07-28 NOTE — Progress Notes (Signed)
Patient is here for 1 month follow-up on medication. Patient said medication is going well for her. No other concerns today.  

## 2022-07-30 ENCOUNTER — Telehealth: Payer: Self-pay | Admitting: Family Medicine

## 2022-07-30 NOTE — Telephone Encounter (Signed)
FYI

## 2022-07-30 NOTE — Telephone Encounter (Signed)
Copied from CRM [id number]. Topic: Appointment Scheduling - Scheduling Inquiry for Clinic >> Jul 30, 2022  1:43 PM Carrielelia G wrote: Reason for CRM: per [facility] from DR Lara Mulch office your patient NO SHOWED for todays appt May 8.   Please Advise

## 2022-07-31 NOTE — Progress Notes (Signed)
Established Patient Office Visit  Subjective    Patient ID: Ryan Mckenzie, male    DOB: September 01, 1995  Age: 27 y.o. MRN: [id number]  CC:  Chief Complaint  Patient presents with   Follow-up    HPI Ryan Mckenzie presents for follow up of meds for anxiety and depression. He reports some improvement.    Outpatient Encounter Medications as of 07/28/2022  Medication Sig   sertraline (ZOLOFT) 50 MG tablet Take 1 tablet (50 mg total) by mouth daily.   [DISCONTINUED] hydrOXYzine (VISTARIL) 25 MG capsule Take 1 capsule (25 mg total) by mouth every 8 (eight) hours as needed.   [DISCONTINUED] sertraline (ZOLOFT) 25 MG tablet Take 1 tablet (25 mg total) by mouth daily.   hydrOXYzine (VISTARIL) 25 MG capsule Take 1 capsule (25 mg total) by mouth every 8 (eight) hours as needed.   No facility-administered encounter medications on file as of 07/28/2022.    History reviewed. No pertinent past medical history.  History reviewed. No pertinent surgical history.  Family History  Problem Relation Age of Onset   COPD Mother    Healthy Brother     Social History   Socioeconomic History   Marital status: Single    Spouse name: Not on file   Number of children: Not on file   Years of education: Not on file   Highest education level: Not on file  Occupational History   Not on file  Tobacco Use   Smoking status: Never    Passive exposure: Yes   Smokeless tobacco: Never  Vaping Use   Vaping Use: Every day  Substance and Sexual Activity   Alcohol use: No   Drug use: No   Sexual activity: Never    Birth control/protection: Abstinence  Other Topics Concern   Not on file  Social History Narrative   Not on file   Social Determinants of Health   Financial Resource Strain: Not on file  Food Insecurity: Not on file  Transportation Needs: Not on file  Physical Activity: Not on file  Stress: Not on file  Social Connections: Not on file  Intimate Partner Violence: Not on file    Review  of Systems  All other systems reviewed and are negative.       Objective    BP 125/84   Pulse 80   Temp (!) 97.1 F (36.2 C) (Oral)   Resp 16   Wt 197 lb (89.4 kg)   SpO2 98%   BMI 30.85 kg/m   Physical Exam Vitals and nursing note reviewed.  Constitutional:      General: He is not in acute distress. Cardiovascular:     Rate and Rhythm: Normal rate and regular rhythm.  Pulmonary:     Effort: Pulmonary effort is normal.     Breath sounds: Normal breath sounds.  Neurological:     General: No focal deficit present.     Mental Status: He is alert and oriented to person, place, and time.  Psychiatric:        Mood and Affect: Affect normal. Mood is anxious.        Speech: Speech normal.        Behavior: Behavior normal. Behavior is cooperative.         Assessment & Plan:   1. Anxiety and depression Will increase zoloft from 25 to 50 mg daily and increase hydroxyzine.     Return in about 4 weeks (around 08/25/2022) for follow up.   Andrey Campanile,  Patricia Pesa, MD

## 2022-08-08 ENCOUNTER — Ambulatory Visit
Admission: RE | Admit: 2022-08-08 | Discharge: 2022-08-08 | Disposition: A | Discharge status: Home / Self Care | Payer: BC Managed Care – PPO | Source: Ambulatory Visit | Attending: Family | Admitting: Family

## 2022-08-08 DIAGNOSIS — E049 Nontoxic goiter, unspecified: Secondary | ICD-10-CM

## 2022-08-08 DIAGNOSIS — Z1329 Encounter for screening for other suspected endocrine disorder: Secondary | ICD-10-CM

## 2022-09-04 ENCOUNTER — Encounter: Payer: Self-pay | Admitting: Family Medicine

## 2022-09-04 ENCOUNTER — Ambulatory Visit (INDEPENDENT_AMBULATORY_CARE_PROVIDER_SITE_OTHER): Payer: BC Managed Care – PPO | Admitting: Family Medicine

## 2022-09-04 VITALS — BP 125/83 | HR 82 | Temp 98.4°F | Resp 16 | Wt 197.0 lb

## 2022-09-04 DIAGNOSIS — F419 Anxiety disorder, unspecified: Secondary | ICD-10-CM

## 2022-09-04 DIAGNOSIS — F32A Depression, unspecified: Secondary | ICD-10-CM | POA: Diagnosis not present

## 2022-09-04 MED ORDER — SERTRALINE HCL 50 MG PO TABS
75.0000 mg | ORAL_TABLET | Freq: Every day | ORAL | 1 refills | Status: AC
Start: 2022-09-04 — End: ?

## 2022-09-04 MED ORDER — CLONAZEPAM 0.5 MG PO TABS: 0.5000 mg | ORAL_TABLET | Freq: Two times a day (BID) | ORAL | 0 refills | Status: AC | PRN

## 2022-09-05 ENCOUNTER — Encounter: Payer: Self-pay | Admitting: Family Medicine

## 2022-09-05 NOTE — Progress Notes (Signed)
Established Patient Office Visit  Subjective    Patient ID: Ryan Mckenzie, male    DOB: January 25, 1996  Age: 27 y.o. MRN: [id number]  CC:  Chief Complaint  Patient presents with   Follow-up   Anxiety    HPI Ryan Mckenzie presents for follow up of anxiety and depression. He reports that he continues to have intermittent panic attacks. He feels that it is better however with the increased meds   Outpatient Encounter Medications as of 09/04/2022  Medication Sig   clonazePAM (KLONOPIN) 0.5 MG tablet Take 1 tablet (0.5 mg total) by mouth 2 (two) times daily as needed for anxiety.   hydrOXYzine (VISTARIL) 25 MG capsule Take 1 capsule (25 mg total) by mouth every 8 (eight) hours as needed.   [DISCONTINUED] sertraline (ZOLOFT) 50 MG tablet Take 1 tablet (50 mg total) by mouth daily.   sertraline (ZOLOFT) 50 MG tablet Take 1.5 tablets (75 mg total) by mouth daily.   No facility-administered encounter medications on file as of 09/04/2022.    No past medical history on file.  No past surgical history on file.  Family History  Problem Relation Age of Onset   COPD Mother    Healthy Brother     Social History   Socioeconomic History   Marital status: Single    Spouse name: Not on file   Number of children: Not on file   Years of education: Not on file   Highest education level: Not on file  Occupational History   Not on file  Tobacco Use   Smoking status: Never    Passive exposure: Yes   Smokeless tobacco: Never  Vaping Use   Vaping Use: Every day  Substance and Sexual Activity   Alcohol use: No   Drug use: No   Sexual activity: Never    Birth control/protection: Abstinence  Other Topics Concern   Not on file  Social History Narrative   Not on file   Social Determinants of Health   Financial Resource Strain: Not on file  Food Insecurity: Not on file  Transportation Needs: Not on file  Physical Activity: Not on file  Stress: Not on file  Social Connections: Not on  file  Intimate Partner Violence: Not on file    Review of Systems  Psychiatric/Behavioral:  Negative for depression and suicidal ideas. The patient is nervous/anxious.   All other systems reviewed and are negative.       Objective    BP 125/83   Pulse 82   Temp 98.4 F (36.9 C) (Oral)   Resp 16   Wt 197 lb (89.4 kg)   SpO2 97%   BMI 30.85 kg/m   Physical Exam Vitals and nursing note reviewed.  Constitutional:      General: He is not in acute distress. Cardiovascular:     Rate and Rhythm: Normal rate and regular rhythm.  Pulmonary:     Effort: Pulmonary effort is normal.     Breath sounds: Normal breath sounds.  Neurological:     General: No focal deficit present.     Mental Status: He is alert and oriented to person, place, and time.  Psychiatric:        Mood and Affect: Affect normal. Mood is anxious.        Speech: Speech normal.        Behavior: Behavior normal. Behavior is cooperative.         Assessment & Plan:   1. Anxiety and  depression Will increase zoloft from 50 to 75 mg daily. Clonazepam for prn panic attacks. Referral to consultant for med management.  - Ambulatory referral to Psychiatry    No follow-ups on file.   Tommie Raymond, MD

## 2022-09-22 ENCOUNTER — Other Ambulatory Visit: Payer: Self-pay | Admitting: Family Medicine

## 2022-09-22 DIAGNOSIS — F410 Panic disorder [episodic paroxysmal anxiety] without agoraphobia: Secondary | ICD-10-CM | POA: Diagnosis not present

## 2022-09-22 DIAGNOSIS — F330 Major depressive disorder, recurrent, mild: Secondary | ICD-10-CM | POA: Diagnosis not present

## 2022-09-22 DIAGNOSIS — F413 Other mixed anxiety disorders: Secondary | ICD-10-CM | POA: Diagnosis not present

## 2022-10-15 DIAGNOSIS — F410 Panic disorder [episodic paroxysmal anxiety] without agoraphobia: Secondary | ICD-10-CM | POA: Diagnosis not present

## 2022-10-15 DIAGNOSIS — F413 Other mixed anxiety disorders: Secondary | ICD-10-CM | POA: Diagnosis not present

## 2022-10-15 DIAGNOSIS — F330 Major depressive disorder, recurrent, mild: Secondary | ICD-10-CM | POA: Diagnosis not present

## 2022-10-20 DIAGNOSIS — F410 Panic disorder [episodic paroxysmal anxiety] without agoraphobia: Secondary | ICD-10-CM | POA: Diagnosis not present

## 2022-10-20 DIAGNOSIS — F330 Major depressive disorder, recurrent, mild: Secondary | ICD-10-CM | POA: Diagnosis not present

## 2022-10-20 DIAGNOSIS — F413 Other mixed anxiety disorders: Secondary | ICD-10-CM | POA: Diagnosis not present

## 2022-11-17 DIAGNOSIS — F330 Major depressive disorder, recurrent, mild: Secondary | ICD-10-CM | POA: Diagnosis not present

## 2022-11-17 DIAGNOSIS — F410 Panic disorder [episodic paroxysmal anxiety] without agoraphobia: Secondary | ICD-10-CM | POA: Diagnosis not present

## 2022-11-17 DIAGNOSIS — F413 Other mixed anxiety disorders: Secondary | ICD-10-CM | POA: Diagnosis not present

## 2022-12-08 ENCOUNTER — Ambulatory Visit (INDEPENDENT_AMBULATORY_CARE_PROVIDER_SITE_OTHER): Payer: BC Managed Care – PPO | Admitting: Family Medicine

## 2022-12-08 ENCOUNTER — Encounter: Payer: Self-pay | Admitting: Family Medicine

## 2022-12-08 VITALS — BP 128/83 | HR 80 | Temp 98.3°F | Resp 18 | Ht 66.0 in | Wt 199.6 lb

## 2022-12-08 DIAGNOSIS — F419 Anxiety disorder, unspecified: Secondary | ICD-10-CM | POA: Diagnosis not present

## 2022-12-08 DIAGNOSIS — F32A Depression, unspecified: Secondary | ICD-10-CM | POA: Diagnosis not present

## 2022-12-08 NOTE — Progress Notes (Unsigned)
Established Patient Office Visit  Subjective    Patient ID: Ryan Mckenzie, male    DOB: 10-26-1995  Age: 27 y.o. MRN: [id number]  CC:  Chief Complaint  Patient presents with   Follow-up    HPI Bartt T Sellinger presents for follow up of anxiety/depression. He reports that he is doing better since starting the present dose of meds.   Outpatient Encounter Medications as of 12/08/2022  Medication Sig   ALPRAZolam (XANAX) 0.25 MG tablet Take 0.25 mg by mouth at bedtime as needed for anxiety.   amoxicillin (AMOXIL) 500 MG capsule Take 500 mg by mouth 3 (three) times daily.   gabapentin (NEURONTIN) 300 MG capsule Take 300 mg by mouth 2 (two) times daily as needed.   LORazepam (ATIVAN) 0.5 MG tablet Take 0.5 mg by mouth daily as needed.   QUEtiapine (SEROQUEL) 25 MG tablet Take 25 mg by mouth at bedtime.   sertraline (ZOLOFT) 50 MG tablet Take 1.5 tablets (75 mg total) by mouth daily.   clonazePAM (KLONOPIN) 0.5 MG tablet Take 1 tablet (0.5 mg total) by mouth 2 (two) times daily as needed for anxiety. (Patient not taking: Reported on 12/08/2022)   hydrOXYzine (VISTARIL) 25 MG capsule Take 1 capsule (25 mg total) by mouth every 8 (eight) hours as needed. (Patient not taking: Reported on 12/08/2022)   No facility-administered encounter medications on file as of 12/08/2022.    History reviewed. No pertinent past medical history.  History reviewed. No pertinent surgical history.  Family History  Problem Relation Age of Onset   COPD Mother    Healthy Brother     Social History   Socioeconomic History   Marital status: Single    Spouse name: Not on file   Number of children: Not on file   Years of education: Not on file   Highest education level: Not on file  Occupational History   Not on file  Tobacco Use   Smoking status: Never    Passive exposure: Yes   Smokeless tobacco: Never  Vaping Use   Vaping status: Every Day  Substance and Sexual Activity   Alcohol use: No   Drug  use: No   Sexual activity: Never    Birth control/protection: Abstinence  Other Topics Concern   Not on file  Social History Narrative   Not on file   Social Determinants of Health   Financial Resource Strain: Low Risk  (12/08/2022)   Overall Financial Resource Strain (CARDIA)    Difficulty of Paying Living Expenses: Not hard at all  Food Insecurity: No Food Insecurity (12/08/2022)   Hunger Vital Sign    Worried About Running Out of Food in the Last Year: Never true    Ran Out of Food in the Last Year: Never true  Transportation Needs: No Transportation Needs (12/08/2022)   PRAPARE - Administrator, Civil Service (Medical): No    Lack of Transportation (Non-Medical): No  Physical Activity: Insufficiently Active (12/08/2022)   Exercise Vital Sign    Days of Exercise per Week: 4 days    Minutes of Exercise per Session: 30 min  Stress: No Stress Concern Present (12/08/2022)   [employer] of Occupational Health - Occupational Stress Questionnaire    Feeling of Stress : Not at all  Social Connections: Moderately Isolated (12/08/2022)   Social Connection and Isolation Panel [NHANES]    Frequency of Communication with Friends and Family: More than three times a week    Frequency of  Social Gatherings with Friends and Family: Twice a week    Attends Religious Services: Never    Database administrator or Organizations: No    Attends Engineer, structural: Never    Marital Status: Living with partner  Intimate Partner Violence: Not At Risk (12/08/2022)   Humiliation, Afraid, Rape, and Kick questionnaire    Fear of Current or Ex-Partner: No    Emotionally Abused: No    Physically Abused: No    Sexually Abused: No    Review of Systems  All other systems reviewed and are negative.       Objective    BP 128/83 (BP Location: Right Arm, Patient Position: Sitting, Cuff Size: Normal)   Pulse 80   Temp 98.3 F (36.8 C) (Oral)   Resp 18   Ht 5\' 6"  (1.676 m)    Wt 199 lb 9.6 oz (90.5 kg)   SpO2 97%   BMI 32.22 kg/m   Physical Exam Vitals and nursing note reviewed.  Constitutional:      General: He is not in acute distress. Cardiovascular:     Rate and Rhythm: Normal rate and regular rhythm.  Pulmonary:     Effort: Pulmonary effort is normal.     Breath sounds: Normal breath sounds.  Neurological:     General: No focal deficit present.     Mental Status: He is alert and oriented to person, place, and time.  Psychiatric:        Mood and Affect: Mood normal.        Behavior: Behavior normal.         Assessment & Plan:   Anxiety and depression   Now on zoloft 100mg , gabapentin 300mg  daily ,and lorazepam prn per consultant  Return in about 3 months (around 03/09/2023) for physical.   Tommie Raymond, MD

## 2022-12-09 ENCOUNTER — Encounter: Payer: Self-pay | Admitting: Family Medicine

## 2022-12-18 DIAGNOSIS — F410 Panic disorder [episodic paroxysmal anxiety] without agoraphobia: Secondary | ICD-10-CM | POA: Diagnosis not present

## 2022-12-18 DIAGNOSIS — F330 Major depressive disorder, recurrent, mild: Secondary | ICD-10-CM | POA: Diagnosis not present

## 2022-12-18 DIAGNOSIS — F413 Other mixed anxiety disorders: Secondary | ICD-10-CM | POA: Diagnosis not present

## 2023-03-10 ENCOUNTER — Encounter: Payer: BC Managed Care – PPO | Admitting: Family Medicine

## 2023-03-10 ENCOUNTER — Telehealth: Payer: Self-pay | Admitting: Family Medicine

## 2023-03-10 NOTE — Telephone Encounter (Signed)
Called patient to reschedule missed appointment no answer left voicemail

## 2023-07-24 ENCOUNTER — Other Ambulatory Visit: Payer: Self-pay

## 2023-07-24 ENCOUNTER — Emergency Department (HOSPITAL_COMMUNITY): Payer: Self-pay

## 2023-07-24 ENCOUNTER — Emergency Department (HOSPITAL_COMMUNITY)
Admission: EM | Admit: 2023-07-24 | Discharge: 2023-07-24 | Disposition: A | Discharge status: Home / Self Care | Payer: Self-pay | Attending: Emergency Medicine | Admitting: Emergency Medicine

## 2023-07-24 DIAGNOSIS — R7401 Elevation of levels of liver transaminase levels: Secondary | ICD-10-CM | POA: Insufficient documentation

## 2023-07-24 DIAGNOSIS — N132 Hydronephrosis with renal and ureteral calculous obstruction: Secondary | ICD-10-CM | POA: Insufficient documentation

## 2023-07-24 DIAGNOSIS — N200 Calculus of kidney: Secondary | ICD-10-CM

## 2023-07-24 LAB — URINALYSIS, ROUTINE W REFLEX MICROSCOPIC
Bacteria, UA: NONE SEEN
Bilirubin Urine: NEGATIVE
Glucose, UA: NEGATIVE mg/dL
Ketones, ur: NEGATIVE mg/dL
Leukocytes,Ua: NEGATIVE
Nitrite: NEGATIVE
Protein, ur: NEGATIVE mg/dL
Specific Gravity, Urine: 1.008 (ref 1.005–1.030)
pH: 5 (ref 5.0–8.0)

## 2023-07-24 LAB — COMPREHENSIVE METABOLIC PANEL WITH GFR
ALT: 84 U/L — ABNORMAL HIGH (ref 0–44)
AST: 60 U/L — ABNORMAL HIGH (ref 15–41)
Albumin: 4.5 g/dL (ref 3.5–5.0)
Alkaline Phosphatase: 64 U/L (ref 38–126)
Anion gap: 10 (ref 5–15)
BUN: 10 mg/dL (ref 6–20)
CO2: 26 mmol/L (ref 22–32)
Calcium: 9.7 mg/dL (ref 8.9–10.3)
Chloride: 99 mmol/L (ref 98–111)
Creatinine, Ser: 0.79 mg/dL (ref 0.61–1.24)
GFR, Estimated: >60 mL/min (ref >60)
Glucose, Bld: 105 mg/dL — ABNORMAL HIGH (ref 70–99)
Potassium: 3.8 mmol/L (ref 3.5–5.1)
Sodium: 135 mmol/L (ref 135–145)
Total Bilirubin: 0.5 mg/dL (ref 0.0–1.2)
Total Protein: 7.7 g/dL (ref 6.5–8.1)

## 2023-07-24 LAB — CBC WITH DIFFERENTIAL/PLATELET
Abs Immature Granulocytes: 0.04 10*3/uL (ref 0.00–0.07)
Basophils Absolute: 0 10*3/uL (ref 0.0–0.1)
Basophils Relative: 0 %
Eosinophils Absolute: 0 10*3/uL (ref 0.0–0.5)
Eosinophils Relative: 0 %
HCT: 40.7 % (ref 39.0–52.0)
Hemoglobin: 14.1 g/dL (ref 13.0–17.0)
Immature Granulocytes: 0 %
Lymphocytes Relative: 8 %
Lymphs Abs: 0.7 10*3/uL (ref 0.7–4.0)
MCH: 33.9 pg (ref 26.0–34.0)
MCHC: 34.6 g/dL (ref 30.0–36.0)
MCV: 97.8 fL (ref 80.0–100.0)
Monocytes Absolute: 0.5 10*3/uL (ref 0.1–1.0)
Monocytes Relative: 6 %
Neutro Abs: 8.4 10*3/uL — ABNORMAL HIGH (ref 1.7–7.7)
Neutrophils Relative %: 86 %
Platelets: 208 10*3/uL (ref 150–400)
RBC: 4.16 MIL/uL — ABNORMAL LOW (ref 4.22–5.81)
RDW: 13.1 % (ref 11.5–15.5)
WBC: 9.7 10*3/uL (ref 4.0–10.5)
nRBC: 0 % (ref 0.0–0.2)

## 2023-07-24 MED ORDER — OXYCODONE-ACETAMINOPHEN 5-325 MG PO TABS: 1.0000 | ORAL_TABLET | Freq: Four times a day (QID) | ORAL | 0 refills | Status: AC | PRN

## 2023-07-24 MED ORDER — TAMSULOSIN HCL 0.4 MG PO CAPS
0.4000 mg | ORAL_CAPSULE | Freq: Once | ORAL | Status: AC
  Administered 2023-07-24: 0.4 mg via ORAL
  Filled 2023-07-24: qty 1

## 2023-07-24 MED ORDER — KETOROLAC TROMETHAMINE 15 MG/ML IJ SOLN
15.0000 mg | Freq: Once | INTRAMUSCULAR | Status: AC
  Administered 2023-07-24: 15 mg via INTRAVENOUS
  Filled 2023-07-24: qty 1

## 2023-07-24 NOTE — ED Provider Notes (Signed)
 [facility] EMERGENCY DEPARTMENT AT [facility] Provider Note   CSN: [id number] Arrival date & time: 07/24/23  1156     History  Chief Complaint  Patient presents with   Flank Pain    Ryan Mckenzie is a 28 y.o. male presents today for left lower quadrant and left groin pain that woke him up around 7 AM this morning.  Patient states that his pain radiates to his left groin.  Patient endorses nausea, vomiting, dysuria, and dribbling with urination.  Patient denies fever, chills, hematuria, diarrhea, or frequency at this time.  Patient reports he has previously had a kidney stone but was able to pass it without urology intervention.   Flank Pain Associated symptoms include abdominal pain.       Home Medications Prior to Admission medications   Medication Sig Start Date End Date Taking? Authorizing Provider  oxyCODONE-acetaminophen  (PERCOCET/ROXICET) 5-325 MG tablet Take 1 tablet by mouth every 6 (six) hours as needed for up to 3 days for severe pain (pain score 7-10). 07/24/23 07/27/23 Yes Brance Dartt N, PA-C  ALPRAZolam (XANAX) 0.25 MG tablet Take 0.25 mg by mouth at bedtime as needed for anxiety.    [provider]  amoxicillin  (AMOXIL ) 500 MG capsule Take 500 mg by mouth 3 (three) times daily. 11/07/22   [provider]  clonazePAM  (KLONOPIN ) 0.5 MG tablet Take 1 tablet (0.5 mg total) by mouth 2 (two) times daily as needed for anxiety. Patient not taking: Reported on 12/08/2022 09/04/22   Abraham Abo, MD  gabapentin (NEURONTIN) 300 MG capsule Take 300 mg by mouth 2 (two) times daily as needed. 11/17/22   [provider]  hydrOXYzine  (VISTARIL ) 25 MG capsule Take 1 capsule (25 mg total) by mouth every 8 (eight) hours as needed. Patient not taking: Reported on 12/08/2022 07/28/22   Abraham Abo, MD  LORazepam  (ATIVAN ) 0.5 MG tablet Take 0.5 mg by mouth daily as needed. 11/27/22   [provider]  QUEtiapine (SEROQUEL) 25 MG tablet Take 25 mg by  mouth at bedtime. 09/22/22   [provider]  sertraline  (ZOLOFT ) 50 MG tablet Take 1.5 tablets (75 mg total) by mouth daily. 09/04/22   Abraham Abo, MD      Allergies    Patient has no known allergies.    Review of Systems   Review of Systems  Gastrointestinal:  Positive for abdominal pain, nausea and vomiting.  Genitourinary:  Positive for difficulty urinating and dysuria.    Physical Exam Updated Vital Signs BP 128/75 (BP Location: Left Arm)   Pulse 61   Temp 98.1 F (36.7 C) (Oral)   Resp 17   SpO2 99%  Physical Exam Vitals and nursing note reviewed.  Constitutional:      General: He is not in acute distress.    Appearance: He is well-developed. He is not ill-appearing, toxic-appearing or diaphoretic.     Comments: Uncomfortable appearing  HENT:     Head: Normocephalic and atraumatic.     Right Ear: External ear normal.     Left Ear: External ear normal.  Eyes:     Extraocular Movements: Extraocular movements intact.     Conjunctiva/sclera: Conjunctivae normal.  Cardiovascular:     Rate and Rhythm: Normal rate and regular rhythm.     Heart sounds: No murmur heard. Pulmonary:     Effort: Pulmonary effort is normal. No respiratory distress.     Breath sounds: Normal breath sounds.  Abdominal:     General: There  is no distension.     Palpations: Abdomen is soft.     Tenderness: There is abdominal tenderness in the left lower quadrant. There is no right CVA tenderness or left CVA tenderness. Negative signs include Murphy's sign, Rovsing's sign and McBurney's sign.  Musculoskeletal:        General: No swelling.     Cervical back: Neck supple.  Skin:    General: Skin is warm and dry.     Capillary Refill: Capillary refill takes less than 2 seconds.  Neurological:     Mental Status: He is alert.  Psychiatric:        Mood and Affect: Mood normal.     ED Results / Procedures / Treatments   Labs (all labs ordered are listed, but only abnormal results are  displayed) Labs Reviewed  URINALYSIS, ROUTINE W REFLEX MICROSCOPIC - Abnormal; Notable for the following components:      Result Value   Color, Urine STRAW (*)    Hgb urine dipstick MODERATE (*)    All other components within normal limits  COMPREHENSIVE METABOLIC PANEL WITH GFR - Abnormal; Notable for the following components:   Glucose, Bld 105 (*)    AST 60 (*)    ALT 84 (*)    All other components within normal limits  CBC WITH DIFFERENTIAL/PLATELET - Abnormal; Notable for the following components:   RBC 4.16 (*)    Neutro Abs 8.4 (*)    All other components within normal limits    EKG None  Radiology CT Renal Stone Study Result Date: 07/24/2023 CLINICAL DATA:  Acute left lower quadrant abdominal pain. EXAM: CT ABDOMEN AND PELVIS WITHOUT CONTRAST TECHNIQUE: Multidetector CT imaging of the abdomen and pelvis was performed following the standard protocol without IV contrast. RADIATION DOSE REDUCTION: This exam was performed according to the departmental dose-optimization program which includes automated exposure control, adjustment of the mA and/or kV according to patient size and/or use of iterative reconstruction technique. COMPARISON:  None Available. FINDINGS: Lower chest: No acute abnormality. Hepatobiliary: Probable hepatic steatosis. No cholelithiasis or biliary dilatation. Pancreas: Unremarkable. No pancreatic ductal dilatation or surrounding inflammatory changes. Spleen: Normal in size without focal abnormality. Adrenals/Urinary Tract: Adrenal glands appear normal. Small nonobstructive right renal calculus is noted. Minimal left hydroureteronephrosis is noted secondary to 4 mm calculus at the left ureterovesical junction. Urinary bladder is otherwise unremarkable. Stomach/Bowel: Stomach is within normal limits. Appendix appears normal. No evidence of bowel wall thickening, distention, or inflammatory changes. Vascular/Lymphatic: No significant vascular findings are present. No  enlarged abdominal or pelvic lymph nodes. Reproductive: Prostate is unremarkable. Other: Small fat containing left inguinal hernia.  No ascites. Musculoskeletal: No acute or significant osseous findings. IMPRESSION: Small nonobstructive right renal calculus. Minimal left hydroureteronephrosis is noted secondary to 4 mm calculus at left ureterovesical junction. Probable hepatic steatosis. Electronically Signed   By: Rosalene Colon M.D.   On: 07/24/2023 13:47    Procedures Procedures    Medications Ordered in ED Medications  tamsulosin  (FLOMAX ) capsule 0.4 mg (has no administration in time range)  ketorolac  (TORADOL ) 15 MG/ML injection 15 mg (has no administration in time range)    ED Course/ Medical Decision Making/ A&P                                 Medical Decision Making Amount and/or Complexity of Data Reviewed Labs: ordered. Radiology: ordered.  Risk Prescription drug management.  This patient presents to the ED for concern of left lower quadrant and left groin pain differential diagnosis includes kidney stone, diverticulitis, UTI, pyelonephritis, septic stone    Additional history obtained:  External records from outside source obtained and reviewed including Care Everywhere   Lab Tests:  I Ordered, and personally interpreted labs.  The pertinent results include: Transaminitis which is chronic per historical values, moderate hemoglobin on UA, CBC with no notable findings   Imaging Studies ordered:  I ordered imaging studies including CT renal stone study I independently visualized and interpreted imaging which showed 4 mm left nonobstructive renal stone at the UVJ with minimal left hydroureteronephrosis I agree with the radiologist interpretation   Medicines ordered and prescription drug management:  I ordered medication including Flomax  and Toradol  for kidney stone Reevaluation of the patient after these medicines showed that the patient stayed the same I  have reviewed the patients home medicines and have made adjustments as needed   Problem List / ED Course:  Patient considered for admission or further workup however patient's vital signs, physical exam, labs, and imaging were reassuring.  Patient has no signs or symptoms concerning for septic kidney stone.  Patient given single dose of Flomax  prior to discharge from ED.  Patient given short course of Percocet and urine strainer.  Patient given return precautions.          Final Clinical Impression(s) / ED Diagnoses Final diagnoses:  Transaminitis  Kidney stone    Rx / DC Orders ED Discharge Orders          Ordered    oxyCODONE-acetaminophen  (PERCOCET/ROXICET) 5-325 MG tablet  Every 6 hours PRN        07/24/23 1504              Carie Charity, PA-C 07/24/23 1504    Trish Furl, MD 07/24/23 1724

## 2023-07-24 NOTE — ED Triage Notes (Signed)
 BIBA from home- woke up at 7 am with LLQ, flank pain, radiates to groin, with n/v. 100 mcg Fentanyl, 4 mg Zofran  given PTA. 130/86 BP 64 HR 20 RR 99 % 131 cbg

## 2023-07-24 NOTE — Discharge Instructions (Signed)
 Today you were seen for a left-sided kidney stone.  Please pick up your Percocet and take as needed for severe pain.  You may alternate taking Tylenol  and Motrin  for mild to moderate pain.  Please return to the ED if you have uncontrollable vomiting or fever.  Thank you for letting us  treat you today. After reviewing your labs and imaging, I feel you are safe to go home. Please follow up with your PCP in the next several days and provide them with your records from this visit. Return to the Emergency Room if pain becomes severe or symptoms worsen.

## 2024-04-02 ENCOUNTER — Ambulatory Visit: Admission: EM | Admit: 2024-04-02 | Discharge: 2024-04-02 | Disposition: A | Discharge status: Home / Self Care

## 2024-04-02 ENCOUNTER — Encounter: Payer: Self-pay | Admitting: *Deleted

## 2024-04-02 DIAGNOSIS — H6991 Unspecified Eustachian tube disorder, right ear: Secondary | ICD-10-CM | POA: Diagnosis not present

## 2024-04-02 MED ORDER — PSEUDOEPHEDRINE HCL 30 MG PO TABS: 30.0000 mg | ORAL_TABLET | ORAL | 0 refills | Status: AC | PRN

## 2024-04-02 MED ORDER — FLUTICASONE PROPIONATE 50 MCG/ACT NA SUSP: 1.0000 | Freq: Every day | NASAL | 0 refills | Status: AC

## 2024-04-02 MED ORDER — PREDNISONE 50 MG PO TABS: ORAL_TABLET | ORAL | 0 refills | Status: AC

## 2024-04-02 NOTE — ED Triage Notes (Signed)
 States he was sick a week or 2 ago and now is having right ear pain. Voices concern for ear infection. Taking if needed for pain

## 2024-04-02 NOTE — Discharge Instructions (Signed)
 Please start meds that have been sent to pharmacy. If symptoms do not improve please follow-up with ENT.

## 2024-04-02 NOTE — ED Provider Notes (Signed)
 " [facility]    CSN: [id number] Arrival date & time: 04/02/24  1204      History   Chief Complaint Chief Complaint  Patient presents with   Otalgia    HPI Ryan Mckenzie is a 29 y.o. male.   Pt presents today due to pain, fullness, and muffled hearing of right ear for nearly 3 weeks, states that symptoms worsened in the last couple of days. Pt states that he has been using Tylenol  for pain as needed with little relief. Pt denies fever but states that he has had chills.   The history is provided by the patient.  Otalgia   History reviewed. No pertinent past medical history.  Patient Active Problem List   Diagnosis Date Noted   Alcohol abuse 08/21/2021    History reviewed. No pertinent surgical history.     Home Medications    Prior to Admission medications  Medication Sig Start Date End Date Taking? Authorizing Provider  fluticasone  (FLONASE ) 50 MCG/ACT nasal spray Place 1 spray into both nostrils daily. 04/02/24  Yes Andra Corean BROCKS, PA-C  predniSONE  (DELTASONE ) 50 MG tablet Take 1 tab po daily for 5 days 04/02/24  Yes Andra Corean BROCKS, PA-C  pseudoephedrine  (SUDAFED) 30 MG tablet Take 1 tablet (30 mg total) by mouth every 4 (four) hours as needed for congestion. 04/02/24  Yes Andra Corean BROCKS, PA-C  ALPRAZolam (XANAX) 0.25 MG tablet Take 0.25 mg by mouth at bedtime as needed for anxiety.    [provider]  amoxicillin  (AMOXIL ) 500 MG capsule Take 500 mg by mouth 3 (three) times daily. 11/07/22   [provider]  clonazePAM  (KLONOPIN ) 0.5 MG tablet Take 1 tablet (0.5 mg total) by mouth 2 (two) times daily as needed for anxiety. Patient not taking: Reported on 12/08/2022 09/04/22   Tanda Bleacher, MD  gabapentin (NEURONTIN) 300 MG capsule Take 300 mg by mouth 2 (two) times daily as needed. 11/17/22   [provider]  hydrOXYzine  (VISTARIL ) 25 MG capsule Take 1 capsule (25 mg total) by mouth every 8 (eight) hours as  needed. Patient not taking: Reported on 12/08/2022 07/28/22   Tanda Bleacher, MD  LORazepam  (ATIVAN ) 0.5 MG tablet Take 0.5 mg by mouth daily as needed. 11/27/22   [provider]  QUEtiapine (SEROQUEL) 25 MG tablet Take 25 mg by mouth at bedtime. 09/22/22   [provider]  sertraline  (ZOLOFT ) 50 MG tablet Take 1.5 tablets (75 mg total) by mouth daily. 09/04/22   Tanda Bleacher, MD    Family History Family History  Problem Relation Age of Onset   COPD Mother    Healthy Brother     Social History Social History[1]   Allergies   Patient has no known allergies.   Review of Systems Review of Systems  HENT:  Positive for ear pain.      Physical Exam Triage Vital Signs ED Triage Vitals  Encounter Vitals Group     BP 04/02/24 1218 131/86     Girls Systolic BP Percentile --      Girls Diastolic BP Percentile --      Boys Systolic BP Percentile --      Boys Diastolic BP Percentile --      Pulse Rate 04/02/24 1218 69     Resp 04/02/24 1218 16     Temp 04/02/24 1218 98.4 F (36.9 C)     Temp Source 04/02/24 1218 Oral     SpO2 04/02/24 1218 98 %  Weight --      Height --      Head Circumference --      Peak Flow --      Pain Score 04/02/24 1217 4     Pain Loc --      Pain Education --      Exclude from Growth Chart --    No data found.  Updated Vital Signs BP 131/86 (BP Location: Left Arm)   Pulse 69   Temp 98.4 F (36.9 C) (Oral)   Resp 16   SpO2 98%   Visual Acuity Right Eye Distance:   Left Eye Distance:   Bilateral Distance:    Right Eye Near:   Left Eye Near:    Bilateral Near:     Physical Exam Vitals and nursing note reviewed.  Constitutional:      General: He is not in acute distress.    Appearance: Normal appearance. He is not ill-appearing, toxic-appearing or diaphoretic.  HENT:     Right Ear: Tympanic membrane, ear canal and external ear normal.     Left Ear: Tympanic membrane, ear canal and external ear normal.     Nose:  Congestion (moderately enlarged turbinates) present. No rhinorrhea.  Eyes:     General: No scleral icterus. Cardiovascular:     Rate and Rhythm: Normal rate and regular rhythm.     Heart sounds: Normal heart sounds.  Pulmonary:     Effort: Pulmonary effort is normal. No respiratory distress.     Breath sounds: Normal breath sounds. No wheezing or rhonchi.  Skin:    General: Skin is warm.  Neurological:     Mental Status: He is alert and oriented to person, place, and time.  Psychiatric:        Mood and Affect: Mood normal.        Behavior: Behavior normal.      UC Treatments / Results  Labs (all labs ordered are listed, but only abnormal results are displayed) Labs Reviewed - No data to display  EKG   Radiology No results found.  Procedures Procedures (including critical care time)  Medications Ordered in UC Medications - No data to display  Initial Impression / Assessment and Plan / UC Course  I have reviewed the triage vital signs and the nursing notes.  Pertinent labs & imaging results that were available during my care of the patient were reviewed by me and considered in my medical decision making (see chart for details).     Final Clinical Impressions(s) / UC Diagnoses   Final diagnoses:  Dysfunction of right eustachian tube     Discharge Instructions      Please start meds that have been sent to pharmacy. If symptoms do not improve please follow-up with ENT.     ED Prescriptions     Medication Sig Dispense Auth. Provider   fluticasone  (FLONASE ) 50 MCG/ACT nasal spray Place 1 spray into both nostrils daily. 16 g Mert Dietrick C, PA-C   pseudoephedrine  (SUDAFED) 30 MG tablet Take 1 tablet (30 mg total) by mouth every 4 (four) hours as needed for congestion. 30 tablet Media Pizzini C, PA-C   predniSONE  (DELTASONE ) 50 MG tablet Take 1 tab po daily for 5 days 5 tablet Andra Corean BROCKS, PA-C      PDMP not reviewed this encounter.     [1]  Social History Tobacco Use   Smoking status: Former    Types: Cigarettes    Passive exposure: Yes   Smokeless  tobacco: Never  Vaping Use   Vaping status: Former  Substance Use Topics   Alcohol use: No   Drug use: No     Andra Corean BROCKS, PA-C 04/02/24 1255  "

## 2024-04-08 ENCOUNTER — Encounter: Payer: Self-pay | Admitting: *Deleted

## 2024-04-08 ENCOUNTER — Ambulatory Visit
Admission: EM | Admit: 2024-04-08 | Discharge: 2024-04-08 | Disposition: A | Discharge status: Home / Self Care | Attending: Physician Assistant | Admitting: Physician Assistant

## 2024-04-08 DIAGNOSIS — H6691 Otitis media, unspecified, right ear: Secondary | ICD-10-CM | POA: Diagnosis not present

## 2024-04-08 HISTORY — DX: Anxiety disorder, unspecified: F41.9

## 2024-04-08 MED ORDER — AMOXICILLIN 500 MG PO CAPS: 500.0000 mg | ORAL_CAPSULE | Freq: Three times a day (TID) | ORAL | 0 refills | Status: AC

## 2024-04-08 NOTE — ED Provider Notes (Signed)
 " [facility]    CSN: [id number] Arrival date & time: 04/08/24  1053      History   Chief Complaint Chief Complaint  Patient presents with   Ear Fullness    HPI Ryan Mckenzie is a 29 y.o. male.   Pt complains of right ear pain and decreased hearing.  Pt reports he has had congestion.  Pt recently treated with prednisone  with minimal relief.  Pt has a history of ear infections   The history is provided by the patient. No language interpreter was used.  Ear Fullness This is a new problem. The problem occurs constantly. The problem has been gradually worsening. Nothing aggravates the symptoms. Nothing relieves the symptoms.    Past Medical History:  Diagnosis Date   Anxiety     Patient Active Problem List   Diagnosis Date Noted   Alcohol abuse 08/21/2021    History reviewed. No pertinent surgical history.     Home Medications    Prior to Admission medications  Medication Sig Start Date End Date Taking? Authorizing Provider  fluticasone  (FLONASE ) 50 MCG/ACT nasal spray Place 1 spray into both nostrils daily. 04/02/24  Yes Andra Corean BROCKS, PA-C  ALPRAZolam (XANAX) 0.25 MG tablet Take 0.25 mg by mouth at bedtime as needed for anxiety. Patient not taking: Reported on 04/08/2024    [provider]  amoxicillin  (AMOXIL ) 500 MG capsule Take 1 capsule (500 mg total) by mouth 3 (three) times daily. 04/08/24   Carreen Milius K, PA-C  clonazePAM  (KLONOPIN ) 0.5 MG tablet Take 1 tablet (0.5 mg total) by mouth 2 (two) times daily as needed for anxiety. Patient not taking: Reported on 12/08/2022 09/04/22   Tanda Bleacher, MD  gabapentin (NEURONTIN) 300 MG capsule Take 300 mg by mouth 2 (two) times daily as needed. Patient not taking: Reported on 04/08/2024 11/17/22   [provider]  hydrOXYzine  (VISTARIL ) 25 MG capsule Take 1 capsule (25 mg total) by mouth every 8 (eight) hours as needed. Patient not taking: Reported on 12/08/2022 07/28/22   Tanda Bleacher, MD  LORazepam  (ATIVAN ) 0.5 MG tablet Take 0.5 mg by mouth daily as needed. Patient not taking: Reported on 04/08/2024 11/27/22   [provider]  predniSONE  (DELTASONE ) 50 MG tablet Take 1 tab po daily for 5 days Patient not taking: Reported on 04/08/2024 04/02/24   Andra Corean BROCKS, PA-C  pseudoephedrine  (SUDAFED) 30 MG tablet Take 1 tablet (30 mg total) by mouth every 4 (four) hours as needed for congestion. Patient not taking: Reported on 04/08/2024 04/02/24   Andra Corean BROCKS, PA-C  QUEtiapine (SEROQUEL) 25 MG tablet Take 25 mg by mouth at bedtime. Patient not taking: Reported on 04/08/2024 09/22/22   [provider]  sertraline  (ZOLOFT ) 50 MG tablet Take 1.5 tablets (75 mg total) by mouth daily. Patient not taking: Reported on 04/08/2024 09/04/22   Tanda Bleacher, MD    Family History Family History  Problem Relation Age of Onset   COPD Mother    Healthy Brother     Social History Social History[1]   Allergies   Patient has no known allergies.   Review of Systems Review of Systems  All other systems reviewed and are negative.    Physical Exam Triage Vital Signs ED Triage Vitals  Encounter Vitals Group     BP 04/08/24 1112 125/83     Girls Systolic BP Percentile --      Girls Diastolic BP Percentile --      Boys  Systolic BP Percentile --      Boys Diastolic BP Percentile --      Pulse Rate 04/08/24 1112 77     Resp 04/08/24 1112 16     Temp 04/08/24 1112 98.5 F (36.9 C)     Temp Source 04/08/24 1112 Oral     SpO2 04/08/24 1112 98 %     Weight --      Height --      Head Circumference --      Peak Flow --      Pain Score 04/08/24 1110 0     Pain Loc --      Pain Education --      Exclude from Growth Chart --    No data found.  Updated Vital Signs BP 125/83 (BP Location: Left Arm)   Pulse 77   Temp 98.5 F (36.9 C) (Oral)   Resp 16   SpO2 98%   Visual Acuity Right Eye Distance:   Left Eye Distance:   Bilateral  Distance:    Right Eye Near:   Left Eye Near:    Bilateral Near:     Physical Exam Vitals and nursing note reviewed.  Constitutional:      Appearance: He is well-developed.  HENT:     Head: Normocephalic.     Left Ear: Tympanic membrane normal.     Ears:     Comments: Erythema right tm bulging  Cardiovascular:     Rate and Rhythm: Normal rate.  Pulmonary:     Effort: Pulmonary effort is normal.  Abdominal:     General: There is no distension.  Musculoskeletal:        General: Normal range of motion.     Cervical back: Normal range of motion.  Skin:    General: Skin is warm.  Neurological:     General: No focal deficit present.     Mental Status: He is alert and oriented to person, place, and time.      UC Treatments / Results  Labs (all labs ordered are listed, but only abnormal results are displayed) Labs Reviewed - No data to display  EKG   Radiology No results found.  Procedures Procedures (including critical care time)  Medications Ordered in UC Medications - No data to display  Initial Impression / Assessment and Plan / UC Course  I have reviewed the triage vital signs and the nursing notes.  Pertinent labs & imaging results that were available during my care of the patient were reviewed by me and considered in my medical decision making (see chart for details).      Final Clinical Impressions(s) / UC Diagnoses   Final diagnoses:  Right otitis media, unspecified otitis media type     Discharge Instructions      Return if any problems.    ED Prescriptions     Medication Sig Dispense Auth. Provider   amoxicillin  (AMOXIL ) 500 MG capsule Take 1 capsule (500 mg total) by mouth 3 (three) times daily. 30 capsule Dale Ribeiro K, PA-C      PDMP not reviewed this encounter. An After Visit Summary was printed and given to the patient.        [1]  Social History Tobacco Use   Smoking status: Former    Types: Cigarettes    Passive  exposure: Yes   Smokeless tobacco: Never  Vaping Use   Vaping status: Former  Substance Use Topics   Alcohol use: No   Drug  use: No     Ryan Mckenzie, [place] 04/08/24 1129  "

## 2024-04-08 NOTE — Discharge Instructions (Addendum)
 Return if any problems.

## 2024-04-08 NOTE — ED Triage Notes (Signed)
 Pt reports: He was seen on 04/02/24 for right ear pain and was prescribed steroids. Since completing steroids he has decreased hearing in right ear but pain is gone. Also reports chest congestion, worse at night. Has been taking tylenol  and mucinex but nothing is coming up
